# Patient Record
Sex: Female | Born: 1995 | Hispanic: No | Marital: Single | Smoking: Current every day smoker
Health system: Southern US, Community
[De-identification: ages and names within clinical notes are randomized; demographics above are authoritative.]

## PROBLEM LIST (undated history)

## (undated) ENCOUNTER — Inpatient Hospital Stay (HOSPITAL_COMMUNITY): Payer: Self-pay

## (undated) DIAGNOSIS — F909 Attention-deficit hyperactivity disorder, unspecified type: Secondary | ICD-10-CM

## (undated) DIAGNOSIS — O99345 Other mental disorders complicating the puerperium: Secondary | ICD-10-CM

## (undated) DIAGNOSIS — Z789 Other specified health status: Secondary | ICD-10-CM

## (undated) DIAGNOSIS — F53 Postpartum depression: Secondary | ICD-10-CM

## (undated) DIAGNOSIS — Z91040 Latex allergy status: Secondary | ICD-10-CM

## (undated) HISTORY — PX: NO PAST SURGERIES: SHX2092

---
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
## 2016-08-03 LAB — OB RESULTS CONSOLE GC/CHLAMYDIA
Chlamydia: NEGATIVE
Gonorrhea: NEGATIVE

## 2016-08-13 ENCOUNTER — Encounter (HOSPITAL_COMMUNITY): Payer: Self-pay

## 2016-08-13 ENCOUNTER — Inpatient Hospital Stay (HOSPITAL_COMMUNITY)
Admission: AD | Admit: 2016-08-13 | Discharge: 2016-08-13 | Disposition: A | Discharge status: Home / Self Care | Payer: Medicaid Other | Source: Ambulatory Visit | Attending: Obstetrics & Gynecology | Admitting: Obstetrics & Gynecology

## 2016-08-13 ENCOUNTER — Inpatient Hospital Stay (HOSPITAL_COMMUNITY): Payer: Medicaid Other

## 2016-08-13 DIAGNOSIS — O4693 Antepartum hemorrhage, unspecified, third trimester: Secondary | ICD-10-CM | POA: Insufficient documentation

## 2016-08-13 DIAGNOSIS — O4692 Antepartum hemorrhage, unspecified, second trimester: Secondary | ICD-10-CM

## 2016-08-13 DIAGNOSIS — O4703 False labor before 37 completed weeks of gestation, third trimester: Secondary | ICD-10-CM | POA: Diagnosis not present

## 2016-08-13 DIAGNOSIS — Z3A30 30 weeks gestation of pregnancy: Secondary | ICD-10-CM | POA: Insufficient documentation

## 2016-08-13 DIAGNOSIS — Z6790 Unspecified blood type, Rh positive: Secondary | ICD-10-CM | POA: Diagnosis not present

## 2016-08-13 DIAGNOSIS — Z87891 Personal history of nicotine dependence: Secondary | ICD-10-CM | POA: Diagnosis not present

## 2016-08-13 DIAGNOSIS — O0932 Supervision of pregnancy with insufficient antenatal care, second trimester: Secondary | ICD-10-CM | POA: Diagnosis not present

## 2016-08-13 DIAGNOSIS — O288 Other abnormal findings on antenatal screening of mother: Secondary | ICD-10-CM | POA: Diagnosis not present

## 2016-08-13 DIAGNOSIS — O26899 Other specified pregnancy related conditions, unspecified trimester: Secondary | ICD-10-CM

## 2016-08-13 DIAGNOSIS — R109 Unspecified abdominal pain: Secondary | ICD-10-CM | POA: Diagnosis present

## 2016-08-13 DIAGNOSIS — Z91040 Latex allergy status: Secondary | ICD-10-CM | POA: Diagnosis not present

## 2016-08-13 DIAGNOSIS — O26893 Other specified pregnancy related conditions, third trimester: Secondary | ICD-10-CM | POA: Insufficient documentation

## 2016-08-13 DIAGNOSIS — Z658 Other specified problems related to psychosocial circumstances: Secondary | ICD-10-CM

## 2016-08-13 DIAGNOSIS — F5089 Other specified eating disorder: Secondary | ICD-10-CM | POA: Diagnosis present

## 2016-08-13 HISTORY — DX: Latex allergy status: Z91.040

## 2016-08-13 LAB — URINALYSIS, ROUTINE W REFLEX MICROSCOPIC
Bilirubin Urine: NEGATIVE
Glucose, UA: NEGATIVE mg/dL
Hgb urine dipstick: NEGATIVE
Ketones, ur: NEGATIVE mg/dL
Nitrite: NEGATIVE
Protein, ur: NEGATIVE mg/dL
SPECIFIC GRAVITY, URINE: 1.023 (ref 1.005–1.030)
pH: 6 (ref 5.0–8.0)

## 2016-08-13 LAB — WET PREP, GENITAL
SPERM: NONE SEEN
Trich, Wet Prep: NONE SEEN
Yeast Wet Prep HPF POC: NONE SEEN

## 2016-08-13 LAB — RAPID URINE DRUG SCREEN, HOSP PERFORMED
AMPHETAMINES: NOT DETECTED
BENZODIAZEPINES: NOT DETECTED
Barbiturates: NOT DETECTED
COCAINE: NOT DETECTED
Opiates: NOT DETECTED
TETRAHYDROCANNABINOL: NOT DETECTED

## 2016-08-13 LAB — CBC
HEMATOCRIT: 32.1 % — AB (ref 36.0–46.0)
HEMOGLOBIN: 10.6 g/dL — AB (ref 12.0–15.0)
MCH: 27.5 pg (ref 26.0–34.0)
MCHC: 33 g/dL (ref 30.0–36.0)
MCV: 83.4 fL (ref 78.0–100.0)
Platelets: 256 10*3/uL (ref 150–400)
RBC: 3.85 MIL/uL — AB (ref 3.87–5.11)
RDW: 14 % (ref 11.5–15.5)
WBC: 8.1 10*3/uL (ref 4.0–10.5)

## 2016-08-13 LAB — ABO/RH: ABO/RH(D): AB POS

## 2016-08-13 LAB — OB RESULTS CONSOLE GC/CHLAMYDIA
CHLAMYDIA, DNA PROBE: NEGATIVE
Gonorrhea: NEGATIVE

## 2016-08-13 LAB — OB RESULTS CONSOLE HIV ANTIBODY (ROUTINE TESTING): HIV: NONREACTIVE

## 2016-08-13 MED ORDER — LACTATED RINGERS IV SOLN
INTRAVENOUS | Status: DC
  Administered 2016-08-13: 999 mL via INTRAVENOUS

## 2016-08-13 MED ORDER — CONCEPT DHA 53.5-38-1 MG PO CAPS: 1.0000 | ORAL_CAPSULE | Freq: Every day | ORAL | 11 refills | Status: DC

## 2016-08-13 MED ORDER — ACETAMINOPHEN 500 MG PO TABS
1000.0000 mg | ORAL_TABLET | Freq: Four times a day (QID) | ORAL | Status: DC | PRN
  Administered 2016-08-13: 1000 mg via ORAL
  Filled 2016-08-13: qty 2

## 2016-08-13 MED ORDER — NIFEDIPINE 10 MG PO CAPS
10.0000 mg | ORAL_CAPSULE | ORAL | Status: AC
  Administered 2016-08-13 (×2): 10 mg via ORAL
  Filled 2016-08-13 (×3): qty 1

## 2016-08-13 NOTE — MAU Note (Addendum)
Contacted Chaplain, Pt. Phone number given. Chaplain with contact Pt. On her private cell. Regarding current situation.

## 2016-08-13 NOTE — Progress Notes (Signed)
Written and verbal d/c instructions given and understanding voiced. 

## 2016-08-13 NOTE — MAU Note (Signed)
SW contacted [phone]), no answer left message. Pt. Desires a consult. After entering room a second time[facility], Pt does not wish to discuss situation while family member is present.
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
## 2016-08-13 NOTE — MAU Note (Signed)
Chaplain called [phone], no answer. RN to page [phone] waiting for return call.

## 2016-08-13 NOTE — Discharge Instructions (Signed)
Back Pain in Pregnancy °Back pain during pregnancy is common. Back pain may be caused by several factors that are related to changes during your pregnancy. °Follow these instructions at home: °Managing pain, stiffness, and swelling  °· If directed, apply ice for sudden (acute) back pain. °¨ Put ice in a plastic bag. °¨ Place a towel between your skin and the bag. °¨ Leave the ice on for 20 minutes, 2-3 times per day. °· If directed, apply heat to the affected area before you exercise: °¨ Place a towel between your skin and the heat pack or heating pad. °¨ Leave the heat on for 20-30 minutes. °¨ Remove the heat if your skin turns bright red. This is especially important if you are unable to feel pain, heat, or cold. You may have a greater risk of getting burned. °Activity  °· Exercise as told by your health care provider. Exercising is the best way to prevent or manage back pain. °· Listen to your body when lifting. If lifting hurts, ask for help or bend your knees. This uses your leg muscles instead of your back muscles. °· Squat down when picking up something from the floor. Do not bend over. °· Only use bed rest as told by your health care provider. Bed rest should only be used for the most severe episodes of back pain. °Standing, Sitting, and Lying Down  °· Do not stand in one place for long periods of time. °· Use good posture when sitting. Make sure your head rests over your shoulders and is not hanging forward. Use a pillow on your lower back if necessary. °· Try sleeping on your side, preferably the left side, with a pillow or two between your legs. If you are sore after a night's rest, your bed may be too soft. A firm mattress may provide more support for your back during pregnancy. °General instructions  °· Do not wear high heels. °· Eat a healthy diet. Try to gain weight within your health care provider's recommendations. °· Use a maternity girdle, elastic sling, or back brace as told by your health care  provider. °· Take over-the-counter and prescription medicines only as told by your health care provider. °· Keep all follow-up visits as told by your health care provider. This is important. This includes any visits with any specialists, such as a physical therapist. °Contact a health care provider if: °· Your back pain interferes with your daily activities. °· You have increasing pain in other parts of your body. °Get help right away if: °· You develop numbness, tingling, weakness, or problems with the use of your arms or legs. °· You develop severe back pain that is not controlled with medicine. °· You have a sudden change in bowel or bladder control. °· You develop shortness of breath, dizziness, or you faint. °· You develop nausea, vomiting, or sweating. °· You have back pain that is a rhythmic, cramping pain similar to labor pains. Labor pain is usually 1-2 minutes apart, lasts for about 1 minute, and involves a bearing down feeling or pressure in your pelvis. °· You have back pain and your water breaks or you have vaginal bleeding. °· You have back pain or numbness that travels down your leg. °· Your back pain developed after you fell. °· You develop pain on one side of your back. °· You see blood in your urine. °· You develop skin blisters in the area of your back pain. °This information is not intended to replace   advice given to you by your health care provider. Make sure you discuss any questions you have with your health care provider. Document Released: 06/23/2005 Document Revised: 08/21/2015 Document Reviewed: 11/27/2014 Elsevier Interactive Patient Education  2017 [employer] Ball CorporationBraxton Hicks Contractions Contractions of the uterus can occur throughout pregnancy, but they are not always a sign that you are in labor. You may have practice contractions called Braxton Hicks contractions. These false labor contractions are sometimes confused with true labor. What are Deberah PeltonBraxton Hicks  contractions? Braxton Hicks contractions are tightening movements that occur in the muscles of the uterus before labor. Unlike true labor contractions, these contractions do not result in opening (dilation) and thinning of the cervix. Toward the end of pregnancy (32-34 weeks), Braxton Hicks contractions can happen more often and may become stronger. These contractions are sometimes difficult to tell apart from true labor because they can be very uncomfortable. You should not feel embarrassed if you go to the hospital with false labor. Sometimes, the only way to tell if you are in true labor is for your health care provider to look for changes in the cervix. The health care provider will do a physical exam and may monitor your contractions. If you are not in true labor, the exam should show that your cervix is not dilating and your water has not broken. If there are no prenatal problems or other health problems associated with your pregnancy, it is completely safe for you to be sent home with false labor. You may continue to have Braxton Hicks contractions until you go into true labor. How can I tell the difference between true labor and false labor?  Differences  False labor  Contractions last 30-70 seconds.: Contractions are usually shorter and not as strong as true labor contractions.  Contractions become very regular.: Contractions are usually irregular.  Discomfort is usually felt in the top of the uterus, and it spreads to the lower abdomen and low back.: Contractions are often felt in the front of the lower abdomen and in the groin.  Contractions do not go away with walking.: Contractions may go away when you walk around or change positions while lying down.  Contractions usually become more intense and increase in frequency.: Contractions get weaker and are shorter-lasting as time goes on.  The cervix dilates and gets thinner.: The cervix usually does not dilate or become thin. Follow  these instructions at home:  Take over-the-counter and prescription medicines only as told by your health care provider.  Keep up with your usual exercises and follow other instructions from your health care provider.  Eat and drink lightly if you think you are going into labor.  If Braxton Hicks contractions are making you uncomfortable:  Change your position from lying down or resting to walking, or change from walking to resting.  Sit and rest in a tub of warm water.  Drink enough fluid to keep your urine clear or pale yellow. Dehydration may cause these contractions.  Do slow and deep breathing several times an hour.  Keep all follow-up prenatal visits as told by your health care provider. This is important. Contact a health care provider if:  You have a fever.  You have continuous pain in your abdomen. Get help right away if:  Your contractions become stronger, more regular, and closer together.  You have fluid leaking or gushing from your vagina.  You pass blood-tinged mucus (bloody show).  You have bleeding from your vagina.  You have low back  pain that you never had before.  You feel your babys head pushing down and causing pelvic pressure.  Your baby is not moving inside you as much as it used to. Summary  Contractions that occur before labor are called Braxton Hicks contractions, false labor, or practice contractions.  Braxton Hicks contractions are usually shorter, weaker, farther apart, and less regular than true labor contractions. True labor contractions usually become progressively stronger and regular and they become more frequent.  Manage discomfort from [facility] contractions by changing position, resting in a warm bath, drinking plenty of water, or practicing deep breathing. This information is not intended to replace advice given to you by your health care provider. Make sure you discuss any questions you have with your health care  provider. Document Released: 03/15/2005 Document Revised: 02/02/2016 Document Reviewed: 02/02/2016 Elsevier Interactive Patient Education  2017 [employer].

## 2016-08-13 NOTE — MAU Note (Signed)
Patient presents via EMS, no prenatal care, abdominal pain onset x 1 week, noticed vaginal bleeding around 10:30 am.

## 2016-08-14 LAB — HIV ANTIBODY (ROUTINE TESTING W REFLEX): HIV Screen 4th Generation wRfx: NONREACTIVE

## 2016-08-16 LAB — GC/CHLAMYDIA PROBE AMP (~~LOC~~) NOT AT ARMC
CHLAMYDIA, DNA PROBE: NEGATIVE
Neisseria Gonorrhea: NEGATIVE

## 2016-08-18 NOTE — Progress Notes (Signed)
CSW received message from MAU staff requesting for CSW to call patient due to social issues.  CSW called patient and she stated this was not a good time to talk because she was just waking up and was taking care of her son.  CSW provided her with contact number for CSW and asked her to call when it is convenient for her.

## 2016-08-19 LAB — OB RESULTS CONSOLE HIV ANTIBODY (ROUTINE TESTING): HIV: NONREACTIVE

## 2016-08-19 LAB — OB RESULTS CONSOLE HEPATITIS B SURFACE ANTIGEN
HEP B S AG: NEGATIVE
HEP B S AG: NEGATIVE

## 2016-08-19 LAB — OB RESULTS CONSOLE RUBELLA ANTIBODY, IGM
RUBELLA: IMMUNE
Rubella: IMMUNE

## 2016-08-19 LAB — OB RESULTS CONSOLE ABO/RH: RH Type: POSITIVE

## 2016-08-19 LAB — OB RESULTS CONSOLE RPR
RPR: NONREACTIVE
RPR: NONREACTIVE

## 2016-08-19 LAB — OB RESULTS CONSOLE ANTIBODY SCREEN: Antibody Screen: NEGATIVE

## 2016-09-08 ENCOUNTER — Encounter (HOSPITAL_COMMUNITY): Payer: Self-pay | Admitting: *Deleted

## 2016-09-08 ENCOUNTER — Emergency Department (HOSPITAL_COMMUNITY)
Admission: EM | Admit: 2016-09-08 | Discharge: 2016-09-08 | Disposition: A | Discharge status: Home / Self Care | Payer: Medicaid Other | Attending: Emergency Medicine | Admitting: Emergency Medicine

## 2016-09-08 DIAGNOSIS — Z87891 Personal history of nicotine dependence: Secondary | ICD-10-CM | POA: Insufficient documentation

## 2016-09-08 DIAGNOSIS — H6122 Impacted cerumen, left ear: Secondary | ICD-10-CM | POA: Insufficient documentation

## 2016-09-08 DIAGNOSIS — Z91040 Latex allergy status: Secondary | ICD-10-CM | POA: Insufficient documentation

## 2016-09-08 DIAGNOSIS — H6091 Unspecified otitis externa, right ear: Secondary | ICD-10-CM | POA: Diagnosis not present

## 2016-09-08 DIAGNOSIS — H9202 Otalgia, left ear: Secondary | ICD-10-CM | POA: Diagnosis present

## 2016-09-08 DIAGNOSIS — H60501 Unspecified acute noninfective otitis externa, right ear: Secondary | ICD-10-CM

## 2016-09-08 MED ORDER — ACETIC ACID 2 % OT SOLN: 4.0000 [drp] | Freq: Three times a day (TID) | OTIC | 0 refills | Status: DC

## 2016-09-08 MED ORDER — ACETIC ACID 2 % OT SOLN: 4.0000 [drp] | Freq: Three times a day (TID) | OTIC | 0 refills | Status: AC

## 2016-09-08 MED ORDER — DOUBLE ANTIBIOTIC 500-10000 UNIT/GM EX OINT: 1.0000 "application " | TOPICAL_OINTMENT | Freq: Two times a day (BID) | CUTANEOUS | 0 refills | Status: DC

## 2016-09-08 MED ORDER — OFLOXACIN 0.3 % OP SOLN: 1.0000 [drp] | Freq: Four times a day (QID) | OPHTHALMIC | 0 refills | Status: DC

## 2016-09-08 NOTE — Discharge Instructions (Signed)
Use acetic acid and oflox drops as prescribed.  Return to ED if you have worsening ear pain, fevers, or chills.  Follow up with your primary care if you still have some ear pain.

## 2016-09-08 NOTE — ED Provider Notes (Signed)
[facility]-EMERGENCY DEPT Provider Note   CSN: [id number] Arrival date & time: 09/08/16  1515     History   Chief Complaint Chief Complaint  Patient presents with  . Otalgia    HPI Dawn Levy is a 21 y.o. female resenting with 3 day history of left ear pain.  Patient states that 3 days ago her left ear was itchy, and she was using a Q-tip to try and relieve the itch. Later that night, she reports having increased sensitivity to loud noises and yelling. The following day she went to a water park. Additionally she reports some nasal congestion and a cough for the past 3 days, but denies sore throat, sinus pressure, eye symptoms, tinnitus, fever, chills, nausea, vomiting, abdominal pain, or chest pain. She has a history of seasonal allergies. She has not taken any thing for allergies, or to relieve her pain. She was at her OB office this morning, and when they take a look, they could not see the eardrum due to blood.  HPI  Past Medical History:  Diagnosis Date  . Latex allergy     Patient Active Problem List   Diagnosis Date Noted  . Pica 08/13/2016  . No prenatal care in current pregnancy in second trimester 08/13/2016  . Social discord 08/13/2016  . Vaginal bleeding in pregnancy, second trimester 08/13/2016  . Abdominal pain affecting pregnancy 08/13/2016    History reviewed. No pertinent surgical history.  OB History    Gravida Para Term Preterm AB Living   2 1 1     1    SAB TAB Ectopic Multiple Live Births           1       Home Medications    Prior to Admission medications   Medication Sig Start Date End Date Taking? Authorizing Provider  acetic acid (VOSOL) 2 % otic solution Place 4 drops into the left ear 3 (three) times daily. 09/08/16 09/15/16  Ward, Chase PicketJaime Pilcher, PA-C  ofloxacin (OCUFLOX) 0.3 % ophthalmic solution Place 1 drop into the left ear 4 (four) times daily. 09/08/16   Ward, Chase PicketJaime Pilcher, PA-C  Prenat-FeFum-FePo-FA-Omega 3 (CONCEPT DHA) 53.5-38-1 MG  CAPS Take 1 capsule by mouth daily. 08/13/16   Donette LarryBhambri, Melanie, CNM    Family History History reviewed. No pertinent family history.  Social History Social History  Substance Use Topics  . Smoking status: Former Games developermoker  . Smokeless tobacco: Never Used  . Alcohol use No     Allergies   Latex   Review of Systems Review of Systems  Constitutional: Negative for chills and fever.  HENT: Positive for ear pain. Negative for tinnitus.   Eyes: Negative for pain.  Respiratory: Positive for cough. Negative for shortness of breath and wheezing.   Cardiovascular: Negative for chest pain.     Physical Exam Updated Vital Signs BP 120/74 (BP Location: Left Arm)   Pulse 93   Temp 98.1 F (36.7 C) (Oral)   Resp 14   SpO2 (!) 88%   Physical Exam  Constitutional: She appears well-developed and well-nourished. No distress.  HENT:  Head: Normocephalic and atraumatic.  Right Ear: Hearing, tympanic membrane, external ear and ear canal normal.  Left Ear: External ear normal.  Nose: Nose normal. Right sinus exhibits no maxillary sinus tenderness and no frontal sinus tenderness. Left sinus exhibits no maxillary sinus tenderness and no frontal sinus tenderness.  Mouth/Throat: Uvula is midline, oropharynx is clear and moist and mucous membranes are normal. No oropharyngeal exudate, posterior  oropharyngeal edema or posterior oropharyngeal erythema.  Cannot see L TM or canal due to cotton obstructing view.  Eyes: Conjunctivae, EOM and lids are normal. Pupils are equal, round, and reactive to light.     ED Treatments / Results  Labs (all labs ordered are listed, but only abnormal results are displayed) Labs Reviewed - No data to display  EKG  EKG Interpretation None       Radiology No results found.  Procedures .Ear Cerumen Removal Date/Time: 09/08/2016 3:45 PM Performed by: Alveria Apley Authorized by: Alveria Apley   Consent:    Consent obtained:  Verbal   Consent  given by:  Patient   Risks discussed:  Pain Procedure details:    Location:  L ear   Procedure type: irrigation   Post-procedure details:    Inspection:  Macerated skin and TM intact   Hearing quality:  Improved   Patient tolerance of procedure:  Tolerated well, no immediate complications   (including critical care time)  Medications Ordered in ED Medications - No data to display   Initial Impression / Assessment and Plan / ED Course  I have reviewed the triage vital signs and the nursing notes.  Pertinent labs & imaging results that were available during my care of the patient were reviewed by me and considered in my medical decision making (see chart for details).     Patient presenting with ear pain and a history of Q-tip use with recent swimming. Initially could not see left TM, but after irrigation, removed copious amounts of earwax and some cotton. Post irrigation, left ear canal erythematous, with organismal growth. TM intact, and without bulging or purulent material noted. Will discharge patient with eardrops for otitis externa. Discussed cost appropriate use of eardrops and return precautions. Patient agrees to plan.  Final Clinical Impressions(s) / ED Diagnoses   Final diagnoses:  Acute otitis externa of right ear, unspecified type    New Prescriptions Discharge Medication List as of 09/08/2016  6:45 PM    START taking these medications   Details  ofloxacin (OCUFLOX) 0.3 % ophthalmic solution Place 1 drop into the left ear 4 (four) times daily., Starting Wed 09/08/2016, Print         [place], [place], PA-C 09/08/16 Johnnye Sima, MD 09/09/16 1229

## 2016-09-08 NOTE — ED Triage Notes (Signed)
Pt reports left ear pain, drainage and ringing in her ear for several days. Pt is approx [redacted] weeks pregnant, no ob/gyn complaints and was seen by pcp today.

## 2016-09-15 LAB — OB RESULTS CONSOLE GC/CHLAMYDIA
CHLAMYDIA, DNA PROBE: NEGATIVE
Gonorrhea: NEGATIVE

## 2016-09-15 LAB — OB RESULTS CONSOLE GBS: GBS: NEGATIVE

## 2016-10-15 ENCOUNTER — Inpatient Hospital Stay (HOSPITAL_COMMUNITY): Payer: Medicaid Other | Admitting: Anesthesiology

## 2016-10-15 ENCOUNTER — Encounter (HOSPITAL_COMMUNITY): Payer: Self-pay | Admitting: *Deleted

## 2016-10-15 ENCOUNTER — Inpatient Hospital Stay (HOSPITAL_COMMUNITY)
Admission: AD | Admit: 2016-10-15 | Discharge: 2016-10-17 | DRG: 775 | Disposition: A | Discharge status: Home / Self Care | Payer: Medicaid Other | Source: Ambulatory Visit | Attending: Obstetrics & Gynecology | Admitting: Obstetrics & Gynecology

## 2016-10-15 DIAGNOSIS — Z91040 Latex allergy status: Secondary | ICD-10-CM

## 2016-10-15 DIAGNOSIS — Z87891 Personal history of nicotine dependence: Secondary | ICD-10-CM

## 2016-10-15 DIAGNOSIS — O134 Gestational [pregnancy-induced] hypertension without significant proteinuria, complicating childbirth: Principal | ICD-10-CM | POA: Diagnosis present

## 2016-10-15 DIAGNOSIS — O133 Gestational [pregnancy-induced] hypertension without significant proteinuria, third trimester: Secondary | ICD-10-CM

## 2016-10-15 DIAGNOSIS — Z3A39 39 weeks gestation of pregnancy: Secondary | ICD-10-CM

## 2016-10-15 DIAGNOSIS — Z3493 Encounter for supervision of normal pregnancy, unspecified, third trimester: Secondary | ICD-10-CM | POA: Diagnosis present

## 2016-10-15 HISTORY — DX: Other specified health status: Z78.9

## 2016-10-15 LAB — CBC
HCT: 34.7 % — ABNORMAL LOW (ref 36.0–46.0)
Hemoglobin: 11.3 g/dL — ABNORMAL LOW (ref 12.0–15.0)
MCH: 26 pg (ref 26.0–34.0)
MCHC: 32.6 g/dL (ref 30.0–36.0)
MCV: 79.8 fL (ref 78.0–100.0)
PLATELETS: 250 10*3/uL (ref 150–400)
RBC: 4.35 MIL/uL (ref 3.87–5.11)
RDW: 15.6 % — AB (ref 11.5–15.5)
WBC: 9.6 10*3/uL (ref 4.0–10.5)

## 2016-10-15 LAB — COMPREHENSIVE METABOLIC PANEL
ALK PHOS: 136 U/L — AB (ref 38–126)
ALT: 11 U/L — AB (ref 14–54)
AST: 22 U/L (ref 15–41)
Albumin: 3.1 g/dL — ABNORMAL LOW (ref 3.5–5.0)
Anion gap: 9 (ref 5–15)
BUN: 20 mg/dL (ref 6–20)
CALCIUM: 9.2 mg/dL (ref 8.9–10.3)
CO2: 19 mmol/L — ABNORMAL LOW (ref 22–32)
CREATININE: 0.54 mg/dL (ref 0.44–1.00)
Chloride: 105 mmol/L (ref 101–111)
GFR calc non Af Amer: >60 mL/min (ref >60)
Glucose, Bld: 87 mg/dL (ref 65–99)
Potassium: 4.2 mmol/L (ref 3.5–5.1)
Sodium: 133 mmol/L — ABNORMAL LOW (ref 135–145)
Total Bilirubin: 0.6 mg/dL (ref 0.3–1.2)
Total Protein: 6.9 g/dL (ref 6.5–8.1)

## 2016-10-15 LAB — POCT FERN TEST: POCT Fern Test: NEGATIVE

## 2016-10-15 LAB — PROTEIN / CREATININE RATIO, URINE
Creatinine, Urine: 99 mg/dL
PROTEIN CREATININE RATIO: 1.29 mg/mg{creat} — AB (ref 0.00–0.15)
TOTAL PROTEIN, URINE: 128 mg/dL

## 2016-10-15 LAB — TYPE AND SCREEN
ABO/RH(D): AB POS
Antibody Screen: NEGATIVE

## 2016-10-15 MED ORDER — PHENYLEPHRINE 40 MCG/ML (10ML) SYRINGE FOR IV PUSH (FOR BLOOD PRESSURE SUPPORT)
80.0000 ug | PREFILLED_SYRINGE | INTRAVENOUS | Status: DC | PRN
  Filled 2016-10-15: qty 10
  Filled 2016-10-15: qty 5

## 2016-10-15 MED ORDER — OXYCODONE-ACETAMINOPHEN 5-325 MG PO TABS: 1.0000 | ORAL_TABLET | ORAL | Status: DC | PRN

## 2016-10-15 MED ORDER — FENTANYL CITRATE (PF) 100 MCG/2ML IJ SOLN: 50.0000 ug | INTRAMUSCULAR | Status: DC | PRN

## 2016-10-15 MED ORDER — ACETAMINOPHEN 325 MG PO TABS
650.0000 mg | ORAL_TABLET | ORAL | Status: DC | PRN
Start: 2016-10-15 — End: 2016-10-15

## 2016-10-15 MED ORDER — OXYTOCIN BOLUS FROM INFUSION
500.0000 mL | Freq: Once | INTRAVENOUS | Status: AC
  Administered 2016-10-15: 500 mL via INTRAVENOUS

## 2016-10-15 MED ORDER — FENTANYL CITRATE (PF) 100 MCG/2ML IJ SOLN
INTRAMUSCULAR | Status: AC
  Filled 2016-10-15: qty 2

## 2016-10-15 MED ORDER — EPHEDRINE 5 MG/ML INJ
10.0000 mg | INTRAVENOUS | Status: DC | PRN
  Filled 2016-10-15: qty 2

## 2016-10-15 MED ORDER — ACETAMINOPHEN 325 MG PO TABS
650.0000 mg | ORAL_TABLET | ORAL | Status: DC | PRN
  Administered 2016-10-15 – 2016-10-16 (×2): 650 mg via ORAL
  Filled 2016-10-15 (×2): qty 2

## 2016-10-15 MED ORDER — OXYCODONE-ACETAMINOPHEN 5-325 MG PO TABS: 2.0000 | ORAL_TABLET | ORAL | Status: DC | PRN

## 2016-10-15 MED ORDER — FENTANYL 2.5 MCG/ML BUPIVACAINE 1/10 % EPIDURAL INFUSION (WH - ANES)
14.0000 mL/h | INTRAMUSCULAR | Status: DC | PRN
  Administered 2016-10-15: 14 mL/h via EPIDURAL
  Filled 2016-10-15: qty 100

## 2016-10-15 MED ORDER — PHENYLEPHRINE 40 MCG/ML (10ML) SYRINGE FOR IV PUSH (FOR BLOOD PRESSURE SUPPORT)
80.0000 ug | PREFILLED_SYRINGE | INTRAVENOUS | Status: DC | PRN
  Filled 2016-10-15: qty 5

## 2016-10-15 MED ORDER — FLEET ENEMA 7-19 GM/118ML RE ENEM: 1.0000 | ENEMA | RECTAL | Status: DC | PRN

## 2016-10-15 MED ORDER — WITCH HAZEL-GLYCERIN EX PADS
1.0000 "application " | MEDICATED_PAD | CUTANEOUS | Status: DC | PRN
  Administered 2016-10-15: 1 via TOPICAL

## 2016-10-15 MED ORDER — DIPHENHYDRAMINE HCL 50 MG/ML IJ SOLN: 12.5000 mg | INTRAMUSCULAR | Status: DC | PRN

## 2016-10-15 MED ORDER — PRENATAL MULTIVITAMIN CH
1.0000 | ORAL_TABLET | Freq: Every day | ORAL | Status: DC
  Administered 2016-10-16 – 2016-10-17 (×2): 1 via ORAL
  Filled 2016-10-15 (×2): qty 1

## 2016-10-15 MED ORDER — OXYTOCIN 40 UNITS IN LACTATED RINGERS INFUSION - SIMPLE MED: 2.5000 [IU]/h | INTRAVENOUS | Status: DC

## 2016-10-15 MED ORDER — DIPHENHYDRAMINE HCL 25 MG PO CAPS: 25.0000 mg | ORAL_CAPSULE | Freq: Four times a day (QID) | ORAL | Status: DC | PRN

## 2016-10-15 MED ORDER — DIBUCAINE 1 % RE OINT: 1.0000 "application " | TOPICAL_OINTMENT | RECTAL | Status: DC | PRN

## 2016-10-15 MED ORDER — OXYTOCIN 40 UNITS IN LACTATED RINGERS INFUSION - SIMPLE MED
INTRAVENOUS | Status: AC
  Filled 2016-10-15: qty 1000

## 2016-10-15 MED ORDER — LIDOCAINE HCL (PF) 1 % IJ SOLN
INTRAMUSCULAR | Status: AC
  Filled 2016-10-15: qty 30

## 2016-10-15 MED ORDER — ONDANSETRON HCL 4 MG/2ML IJ SOLN: 4.0000 mg | Freq: Four times a day (QID) | INTRAMUSCULAR | Status: DC | PRN

## 2016-10-15 MED ORDER — IBUPROFEN 600 MG PO TABS
600.0000 mg | ORAL_TABLET | Freq: Four times a day (QID) | ORAL | Status: DC
  Administered 2016-10-15 – 2016-10-17 (×6): 600 mg via ORAL
  Filled 2016-10-15 (×8): qty 1

## 2016-10-15 MED ORDER — TETANUS-DIPHTH-ACELL PERTUSSIS 5-2.5-18.5 LF-MCG/0.5 IM SUSP: 0.5000 mL | Freq: Once | INTRAMUSCULAR | Status: DC

## 2016-10-15 MED ORDER — LACTATED RINGERS IV SOLN
INTRAVENOUS | Status: DC
  Administered 2016-10-15: 11:00:00 via INTRAVENOUS

## 2016-10-15 MED ORDER — LIDOCAINE HCL (PF) 1 % IJ SOLN
INTRAMUSCULAR | Status: DC | PRN
  Administered 2016-10-15 (×2): 5 mL via EPIDURAL

## 2016-10-15 MED ORDER — SENNOSIDES-DOCUSATE SODIUM 8.6-50 MG PO TABS
2.0000 | ORAL_TABLET | ORAL | Status: DC
  Administered 2016-10-16: 2 via ORAL
  Filled 2016-10-15 (×2): qty 2

## 2016-10-15 MED ORDER — SIMETHICONE 80 MG PO CHEW: 80.0000 mg | CHEWABLE_TABLET | ORAL | Status: DC | PRN

## 2016-10-15 MED ORDER — ONDANSETRON HCL 4 MG/2ML IJ SOLN: 4.0000 mg | INTRAMUSCULAR | Status: DC | PRN

## 2016-10-15 MED ORDER — COCONUT OIL OIL: 1.0000 "application " | TOPICAL_OIL | Status: DC | PRN

## 2016-10-15 MED ORDER — LACTATED RINGERS IV SOLN
500.0000 mL | Freq: Once | INTRAVENOUS | Status: AC
  Administered 2016-10-15: 500 mL via INTRAVENOUS

## 2016-10-15 MED ORDER — FENTANYL CITRATE (PF) 100 MCG/2ML IJ SOLN
100.0000 ug | INTRAMUSCULAR | Status: DC | PRN
  Administered 2016-10-15 (×2): 100 ug via INTRAVENOUS
  Filled 2016-10-15: qty 2

## 2016-10-15 MED ORDER — ZOLPIDEM TARTRATE 5 MG PO TABS: 5.0000 mg | ORAL_TABLET | Freq: Every evening | ORAL | Status: DC | PRN

## 2016-10-15 MED ORDER — MISOPROSTOL 200 MCG PO TABS
ORAL_TABLET | ORAL | Status: AC
  Filled 2016-10-15: qty 4

## 2016-10-15 MED ORDER — LACTATED RINGERS IV SOLN: 500.0000 mL | INTRAVENOUS | Status: DC | PRN

## 2016-10-15 MED ORDER — MISOPROSTOL 200 MCG PO TABS
800.0000 ug | ORAL_TABLET | Freq: Once | ORAL | Status: AC
  Administered 2016-10-15: 800 ug via RECTAL

## 2016-10-15 MED ORDER — ONDANSETRON HCL 4 MG PO TABS: 4.0000 mg | ORAL_TABLET | ORAL | Status: DC | PRN

## 2016-10-15 MED ORDER — BENZOCAINE-MENTHOL 20-0.5 % EX AERO: 1.0000 "application " | INHALATION_SPRAY | CUTANEOUS | Status: DC | PRN

## 2016-10-15 MED ORDER — SOD CITRATE-CITRIC ACID 500-334 MG/5ML PO SOLN: 30.0000 mL | ORAL | Status: DC | PRN

## 2016-10-15 MED ORDER — LIDOCAINE HCL (PF) 1 % IJ SOLN
30.0000 mL | INTRAMUSCULAR | Status: DC | PRN
  Filled 2016-10-15: qty 30

## 2016-10-15 NOTE — MAU Provider Note (Signed)
S: [name] is a 21 y.o. G2P1001 at 6815w3d  who presents to MAU today complaining of leaking of fluid since 4 pm yesterday & contractions since this morning. States leaking looks clear with bloody mucous. Positive fetal movement. Denies headache, visual disturbance, or epigastric pain. Hx of elevated BPs when in labor with previous baby.    O: BP (!) 136/93 (BP Location: Right Arm)   Pulse 81   Temp 97.7 F (36.5 C) (Oral)   Resp 18   SpO2 99%  GENERAL: Well-developed, well-nourished female in no acute distress.  HEAD: Normocephalic, atraumatic.  CHEST: Normal effort of breathing, regular heart rate ABDOMEN: Soft, nontender, gravid PELVIC: Normal external female genitalia. Vagina is pink and rugated. Cervix with normal contour, no lesions. Normal discharge.  No pooling. Small amount of blood tinged mucous.   Cervical exam:  Dilation: 5.5 Effacement (%): 80 Station: -2 Presentation: Vertex Exam by:: Estanislado SpireE Ceili Boshers NP   Fetal Monitoring: Baseline: 135 Variability: moderate Accelerations: 15x15 Decelerations: none Contractions: Q4-6 minutes  Results for orders placed or performed during the hospital encounter of 10/15/16 (from the past 24 hour(s))  POCT fern test     Status: Normal (Preliminary result)   Collection Time: 10/15/16 10:18 AM  Result Value Ref Range   POCT Fern Test Negative = intact amniotic membranes      A: SIUP at 6815w3d  Membranes intact  1. Indication for care in labor and delivery, antepartum   Active labor at term   P: Admit to birthing suites  [facility] ordered for elevated BPs GBS negative  Judeth HornLawrence, Berthold Glace, NP 10/15/2016 10:19 AM

## 2016-10-15 NOTE — Anesthesia Preprocedure Evaluation (Signed)
Anesthesia Evaluation  Patient identified by MRN, date of birth, ID band Patient awake    Reviewed: Allergy & Precautions, NPO status , Patient's Chart, lab work & pertinent test results  History of Anesthesia Complications Negative for: history of anesthetic complications  Airway Mallampati: II  TM Distance: >3 FB Neck ROM: Full    Dental no notable dental hx. (+) Dental Advisory Given   Pulmonary former smoker,    Pulmonary exam normal        Cardiovascular negative cardio ROS Normal cardiovascular exam     Neuro/Psych negative neurological ROS  negative psych ROS   GI/Hepatic negative GI ROS, Neg liver ROS,   Endo/Other  negative endocrine ROS  Renal/GU negative Renal ROS  negative genitourinary   Musculoskeletal negative musculoskeletal ROS (+)   Abdominal   Peds negative pediatric ROS (+)  Hematology negative hematology ROS (+)   Anesthesia Other Findings   Reproductive/Obstetrics (+) Pregnancy                             Anesthesia Physical Anesthesia Plan  ASA: II  Anesthesia Plan: Epidural   Post-op Pain Management:    Induction:   PONV Risk Score and Plan:   Airway Management Planned: Natural Airway  Additional Equipment:   Intra-op Plan:   Post-operative Plan:   Informed Consent: I have reviewed the patients History and Physical, chart, labs and discussed the procedure including the risks, benefits and alternatives for the proposed anesthesia with the patient or authorized representative who has indicated his/her understanding and acceptance.   Dental advisory given  Plan Discussed with: Anesthesiologist  Anesthesia Plan Comments:         Anesthesia Quick Evaluation

## 2016-10-15 NOTE — MAU Note (Signed)
C/o ucs since 2200 last night; states that she has had some bloody show; ?SROM @1400  yesterday; brught in by EMS;

## 2016-10-15 NOTE — Anesthesia Postprocedure Evaluation (Signed)
Anesthesia Post Note  Patient: Dawn Levy  Procedure(s) Performed: * No procedures listed *     Patient location during evaluation: Mother Baby Anesthesia Type: Epidural Level of consciousness: awake and alert and oriented Pain management: pain level controlled Respiratory status: spontaneous breathing and nonlabored ventilation Cardiovascular status: stable Postop Assessment: no headache, patient able to bend at knees, no backache, no signs of nausea or vomiting, epidural receding and adequate PO intake Anesthetic complications: no    Last Vitals:  Vitals:   10/15/16 1628 10/15/16 1723  BP: 134/62 (!) 122/57  Pulse: 73 82  Resp: 18 19  Temp: 37.1 C 36.7 C    Last Pain:  Vitals:   10/15/16 1723  TempSrc: Oral  PainSc:    Pain Goal:                 [name],[name]

## 2016-10-15 NOTE — H&P (Signed)
Obstetric History and Physical  Dawn Levy is a 21 y.o. G2P1001 with IUP at 5289w3d presenting for SOL. Patient states she has been having  regular, every 2-3 minutes contractions, none vaginal bleeding, intact, ruptured membranes, with active fetal movement.    Pren[facility]  with onset of care at 8 months of gestation. She feels there was a lot of things that may not have been offered due to her late initialOB visit.  She has had gestational HTN in both pregnancies. She was not treated with antihypertensives during either pregnancy. She denies RUQ pain, headache, or changes in vision. But notes that as she is not wearing her glasses today so she's not sure if she could tell us if she did have a HA as opposed to strain from not wearing glasses.   Pregnancy complications or risks: Patient Active Problem List   Diagnosis Date Noted  . Indication for care in labor and delivery, antepartum 10/15/2016  . Gestational (pregnancy-induced) hypertension without significant proteinuria, third trimester 10/15/2016  . Pica 08/13/2016  . No prenatal care in current pregnancy in second trimester 08/13/2016  . Social discord 08/13/2016  . Vaginal bleeding in pregnancy, second trimester 08/13/2016  . Abdominal pain affecting pregnancy 08/13/2016   She plans to breastfeed She desires oral progesterone-only contraceptive for postpartum contraception.  She does want a circumcision   Prenatal labs and studies: ABO, Rh: --/--/AB POS (07/20 1100) Antibody: NEG (07/20 1100) Rubella: Immune, Immune (05/24 0000) RPR: Nonreactive, Nonreactive (05/24 0000)  HBsAg: Negative, Negative (05/24 0000)  HIV: Non-reactive (05/24 0000)  ZOX:WRUEAVWUGBS:Negative (06/20 0000) 1 hr Glucola  normal   Prenatal Transfer Tool  Maternal Diabetes: No Maternal Substance Abuse:  No Significant Maternal Medications:  None Significant Maternal Lab Results: None  Past Medical History:   Diagnosis Date  . Latex allergy   . Medical history non-contributory     Past Surgical History:  Procedure Laterality Date  . NO PAST SURGERIES      OB History  Gravida Para Term Preterm AB Living  2 1 1     1   SAB TAB Ectopic Multiple Live Births          1    # Outcome Date GA Lbr Len/2nd Weight Sex Delivery Anes PTL Lv  2 Current           1 Term               Social History   Social History  . Marital status: Single    Spouse name: N/A  . Number of children: N/A  . Years of education: N/A   Social History Main Topics  . Smoking status: Former Games developermoker  . Smokeless tobacco: Former NeurosurgeonUser  . Alcohol use No  . Drug use: No  . Sexual activity: Yes   Other Topics Concern  . None   Social History Narrative  . None    History reviewed. No pertinent family history.  Prescriptions Prior to Admission  Medication Sig Dispense Refill Last Dose  . acetaminophen (TYLENOL) 325 MG tablet Take 650 mg by mouth every 6 (six) hours as needed for mild pain.   Past Week at Unknown time  . albuterol (PROVENTIL HFA;VENTOLIN HFA) 108 (90 Base) MCG/ACT inhaler Inhale into the lungs every 6 (six) hours as needed for wheezing or shortness of breath.   prn  . Prenat-FeFum-FePo-FA-Omega 3 (CONCEPT DHA) 53.5-38-1 MG CAPS Take 1 capsule by mouth daily. 30 capsule  11 Past Month at Unknown time  . ofloxacin (OCUFLOX) 0.3 % ophthalmic solution Place 1 drop into the left ear 4 (four) times daily. (Patient not taking: Reported on 10/15/2016) 5 mL 0 Not Taking at Unknown time    Allergies  Allergen Reactions  . Latex     Review of Systems: Negative except for what is mentioned in HPI.  Physical Exam: BP 129/80   Pulse 74   Temp 97.8 F (36.6 C) (Oral)   Resp 18   Ht 5\' 3"  (1.6 m)   Wt 85.7 kg (189 lb)   SpO2 99%   BMI 33.48 kg/m  CONSTITUTIONAL: Well-developed, well-nourished female in no acute distress.  HENT:  Normocephalic, atraumatic, External right and left ear normal.  Oropharynx is clear and moist EYES: Conjunctivae and EOM are normal. Pupils are equal, round, and reactive to light. No scleral icterus.  NECK: Normal range of motion, supple, no masses SKIN: Skin is warm and dry. No rash noted. Not diaphoretic. No erythema. No pallor. NEUROLOGIC: Alert and oriented to person, place, and time. Normal reflexes, muscle tone coordination. No cranial nerve deficit noted. PSYCHIATRIC: Normal mood and affect. Normal behavior. Normal judgment and thought content. CARDIOVASCULAR: Normal heart rate noted, regular rhythm RESPIRATORY: Effort and breath sounds normal, no problems with respiration noted ABDOMEN: Soft, nontender, nondistended, gravid. MUSCULOSKELETAL: Normal range of motion. No edema and no tenderness. 2+ distal pulses.  Dilation: 10 Dilation Complete Date: 10/15/16 Dilation Complete Time: 1245 Effacement (%): 100 Station: +2 Presentation: Vertex Exam by:: AThersa Salt RN  FHT:  Baseline rate   bpm 140 Variability moderate  Accelerations present   Decelerations none Contractions: Every 4-6 mins per the patient    Pertinent Labs/Studies:   Results for orders placed or performed during the hospital encounter of 10/15/16 (from the past 24 hour(s))  POCT fern test     Status: Normal (Preliminary result)   Collection Time: 10/15/16 10:18 AM  Result Value Ref Range   POCT Fern Test Negative = intact amniotic membranes   CBC     Status: Abnormal   Collection Time: 10/15/16 10:42 AM  Result Value Ref Range   WBC 9.6 4.0 - 10.5 K/uL   RBC 4.35 3.87 - 5.11 MIL/uL   Hemoglobin 11.3 (L) 12.0 - 15.0 g/dL   HCT 16.1 (L) 09.6 - 04.5 %   MCV 79.8 78.0 - 100.0 fL   MCH 26.0 26.0 - 34.0 pg   MCHC 32.6 30.0 - 36.0 g/dL   RDW 40.9 (H) 81.1 - 91.4 %   Platelets 250 150 - 400 K/uL  Comprehensive metabolic panel     Status: Abnormal   Collection Time: 10/15/16 10:42 AM  Result Value Ref Range   Sodium 133 (L) 135 - 145 mmol/L   Potassium 4.2 3.5 - 5.1 mmol/L    Chloride 105 101 - 111 mmol/L   CO2 19 (L) 22 - 32 mmol/L   Glucose, Bld 87 65 - 99 mg/dL   BUN 20 6 - 20 mg/dL   Creatinine, Ser 7.82 0.44 - 1.00 mg/dL   Calcium 9.2 8.9 - 95.6 mg/dL   Total Protein 6.9 6.5 - 8.1 g/dL   Albumin 3.1 (L) 3.5 - 5.0 g/dL   AST 22 15 - 41 U/L   ALT 11 (L) 14 - 54 U/L   Alkaline Phosphatase 136 (H) 38 - 126 U/L   Total Bilirubin 0.6 0.3 - 1.2 mg/dL   GFR calc non Af Amer >60 >60 mL/min  GFR calc Af Amer >60 >60 mL/min   Anion gap 9 5 - 15  Type and screen [facility]     Status: None   Collection Time: 10/15/16 11:00 AM  Result Value Ref Range   ABO/RH(D) AB POS    Antibody Screen NEG    Sample Expiration 10/18/2016     Assessment : Dawn Levy is a 21 y.o. G2P1001 at [redacted]w[redacted]d being admitted for SOL  Plan: Labor: Expectant management. Analgesia as needed. FWB: Reassuring fetal heart tracing.  GBS negative Delivery plan: Hopeful for vaginal delivery Check PreE labs and monitor blood pressures.   Lilian Kapur MS3  OB FELLOW HISTORY AND PHYSICAL ATTESTATION  I confirm that I have verified the information documented in the medical students's note and edited as needed and that I have also personally performed the physical exam and all medical decision making activities. In addition: Patient in advance labor. Expectant delivery soon.Monitor BPs.   Caryl Ada, DO  10/15/2016, 4:10 PM

## 2016-10-15 NOTE — Progress Notes (Signed)
Pt oob to br voided small amt-clean catch urine obtained-small amt caught in specimen

## 2016-10-15 NOTE — Anesthesia Procedure Notes (Signed)
Epidural Patient location during procedure: OB Start time: 10/15/2016 12:32 PM End time: 10/15/2016 12:42 PM  Staffing Anesthesiologist: Heather RobertsSINGER, Nyiesha Beever Performed: anesthesiologist   Preanesthetic Checklist Completed: patient identified, site marked, pre-op evaluation, timeout performed, IV checked, risks and benefits discussed and monitors and equipment checked  Epidural Patient position: sitting Prep: DuraPrep Patient monitoring: heart rate, cardiac monitor, continuous pulse ox and blood pressure Approach: midline Location: L2-L3 Injection technique: LOR saline  Needle:  Needle type: Tuohy  Needle gauge: 17 G Needle length: 9 cm Needle insertion depth: 6 cm Catheter size: 20 Guage Catheter at skin depth: 11 cm Test dose: negative and Other  Assessment Events: blood not aspirated, injection not painful, no injection resistance and negative IV test  Additional Notes Informed consent obtained prior to proceeding including risk of failure, 1% risk of PDPH, risk of minor discomfort and bruising.  Discussed rare but serious complications including epidural abscess, permanent nerve injury, epidural hematoma.  Discussed alternatives to epidural analgesia and patient desires to proceed.  Timeout performed pre-procedure verifying patient name, procedure, and platelet count.  Patient tolerated procedure well.

## 2016-10-16 DIAGNOSIS — Z3A39 39 weeks gestation of pregnancy: Secondary | ICD-10-CM

## 2016-10-16 LAB — RPR: RPR Ser Ql: NONREACTIVE

## 2016-10-16 NOTE — Lactation Note (Signed)
This note was copied from a baby's chart. Lactation Consultation Note  Patient Name: Dawn Levy EAVWU'JToday's Date: 10/16/2016 Reason for consult: Initial assessment  Baby is 25 hours old and has been to the breast majority of feeding except one feeding with a bottle of 10 ml of EBM ,  LC reviewed doc flow sheets , voids and stools QS, Latch scores 8-10 . Mom denies soreness.  At consult baby showing feeding cues, mom latched the baby , just needed to be shown how to flip upper lip to flanges position.  Multiple swallows noted, increased with breast compressions. Baby fed was still feeding at 10 mins.  When LC went back to check on nom she mentioned the baby had fed 45 mins.  Mom already has the hand pump and aware of how to use it.  Mother informed of post-discharge support and given phone number to the lactation department, including services for phone call assistance; out-patient appointments; and breastfeeding support group. List of other breastfeeding resources in the community given in the handout. Encouraged mother to call for problems or concerns related to breastfeeding.   Maternal Data Has patient been taught Hand Expression?: Yes  Feeding Feeding Type: Breast Fed Length of feed: 45 min (multiple swallows noted )  LATCH Score/Interventions Latch: Grasps breast easily, tongue down, lips flanged, rhythmical sucking. Intervention(s): Adjust position;Assist with latch;Breast massage;Breast compression  Audible Swallowing: Spontaneous and intermittent  Type of Nipple: Everted at rest and after stimulation  Comfort (Breast/Nipple): Soft / non-tender     Hold (Positioning): Assistance needed to correctly position infant at breast and maintain latch. Intervention(s): Breastfeeding basics reviewed;Support Pillows;Position options;Skin to skin  LATCH Score: 9  Lactation Tools Discussed/Used Tools: Pump ([facility] RN had given mom a hand pump yesterday ) Breast pump type:  Manual Pump Review: Setup, frequency, and cleaning (already by the RN )   Consult Status Consult Status: Follow-up Date: 10/16/16 Follow-up type: In-patient    Matilde SprangMargaret Ann Tessia Kassin 10/16/2016, 3:20 PM

## 2016-10-16 NOTE — Progress Notes (Signed)
Post Partum Day 1 Subjective: no complaints, up ad lib, voiding, tolerating PO and + flatus States that she would now like a cheaper option for a circ and will do this outpatient.  Objective: Blood pressure (!) 122/57, pulse 68, temperature (!) 97.5 F (36.4 C), temperature source Oral, resp. rate 20, height 5\' 3"  (1.6 m), weight 85.7 kg (189 lb), SpO2 99 %, unknown if currently breastfeeding.  Physical Exam:  General: alert and cooperative Lochia: appropriate Uterine Fundus: firm Incision: n/a DVT Evaluation: No evidence of DVT seen on physical exam. Negative Homan's sign. No significant calf/ankle edema.   Recent Labs  10/15/16 1042  HGB 11.3*  HCT 34.7*    Assessment/Plan: Plan for discharge tomorrow   LOS: 1 day   [place] Shirley 10/16/2016, 6:42 AM   OB FELLOW POSTPARTUM PROGRESS NOTE ATTESTATION  I have seen and examined this patient and agree with above documentation in the resident's note.   Frederik PearJulie P Demian Maisel, MD OB Fellow 9:08 AM
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
## 2016-10-17 DIAGNOSIS — Z3A39 39 weeks gestation of pregnancy: Secondary | ICD-10-CM

## 2016-10-17 MED ORDER — IBUPROFEN 600 MG PO TABS: 600.0000 mg | ORAL_TABLET | Freq: Four times a day (QID) | ORAL | 0 refills | Status: DC

## 2016-10-17 MED ORDER — DOCUSATE SODIUM 100 MG PO CAPS: 100.0000 mg | ORAL_CAPSULE | Freq: Two times a day (BID) | ORAL | 2 refills | Status: DC | PRN

## 2016-10-17 NOTE — Plan of Care (Signed)
Problem: Coping: Goal: Ability to cope will improve Outcome: Progressing Social work consult in place.

## 2016-10-17 NOTE — Progress Notes (Signed)
CSW received a phone call from [name]'s bedside RN noting [name] seems to be expressing odd behaviors such as hoarding diapers and requesting photos of the car seat to show her mom before supplying the $30.00 cash to pay. This Clinical research associatewriter informed RN that she is unable to provide pictures of the car seat for [name]'s mother as that is not something we do. RN understood and noted she would inform [name] and [name]'s mom. Additionally, RN noted at the very least she will take photos of the car seat after obtaining it from house coverage and send pictures herself.   RN expressed concern for [name] not being able to be awoken overnight to care for the baby and requested this writer confirm [name] has the support of [name]. This Clinical research associatewriter confirmed that baby and [name] will have the support of [name] upon d/c from the hospital. Additionally, [name] identified her aunt and uncle as supports locally.   Dawn Levy, MSW, LCSW-A Clinical Social Worker  [facility]  Office: [phone]

## 2016-10-17 NOTE — Discharge Summary (Signed)
Obstetric Discharge Summary Reason for Admission: onset of labor Prenatal Procedures: ultrasound Intrapartum Procedures: spontaneous vaginal delivery Postpartum Procedures: none Complications-Operative and Postpartum: none Hemoglobin  Date Value Ref Range Status  10/15/2016 11.3 (L) 12.0 - 15.0 g/dL Final   HCT  Date Value Ref Range Status  10/15/2016 34.7 (L) 36.0 - 46.0 % Final    Physical Exam:  General: alert, cooperative and no distress Lochia: appropriate Uterine Fundus: firm Incision: n/a DVT Evaluation: No evidence of DVT seen on physical exam. Negative Homan's sign. No cords or calf tenderness. No significant calf/ankle edema.  Discharge Diagnoses: Term Pregnancy-delivered  Discharge Information: Date: 10/17/2016 Activity: pelvic rest Diet: routine Medications: Ibuprofen and Colace Condition: stable Instructions: refer to practice specific booklet Discharge to: home  Contraception: OCPs Follow-up Information    [facility]. Schedule an appointment as soon as possible for a visit in 6 week(s).   Contact information: [address] [place] [id number] [phone]           Newborn Data: Live born female  Birth Weight: 7 lb 10.4 oz (3470 g) APGAR: 9, 9  Home with mother.  Sharen CounterLisa Leftwich-Kirby 10/17/2016, 9:56 AM
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
## 2016-10-17 NOTE — Progress Notes (Signed)
CSW acknowledges consult regarding [name]'s need for a car seat and needing to confirm [name]'s living with grandparent. This writer met with [name] yesterday and discussed car seat needs. House coverage will need to provide [name] with car seat and car seat release form to complete at the time of d/c. [name] aware of $30.00 cash cost. Additionally, [name] confirmed with this writer that she is living with her mother, baby's [name] and her older son Ezekiel Paloma that is 1 years old. Please see CSW note from 10/16/2016 for further detail.   Tynan Boesel, MSW, LCSW-A Clinical Social Worker  [facility]  Office: [phone]   

## 2016-10-18 ENCOUNTER — Ambulatory Visit: Payer: Self-pay

## 2016-10-18 NOTE — Progress Notes (Signed)
CSW was informed by CPS Supervisior, Josh Cannon, that there are no barriers to d/c and CPS will follow-up with family after d/c.  CSW updated bedside nurse and Teaching Service.   Mate Alegria Boyd-Gilyard, MSW, LCSW Clinical Social Work [phone] 
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
## 2016-10-21 NOTE — Progress Notes (Signed)
Post discharge chart review completed.  

## 2017-05-18 ENCOUNTER — Encounter (HOSPITAL_COMMUNITY): Payer: Self-pay | Admitting: Family Medicine

## 2017-05-18 ENCOUNTER — Emergency Department (HOSPITAL_COMMUNITY)
Admission: EM | Admit: 2017-05-18 | Discharge: 2017-05-19 | Disposition: A | Discharge status: Home / Self Care | Payer: Medicaid Other | Attending: Emergency Medicine | Admitting: Emergency Medicine

## 2017-05-18 DIAGNOSIS — F4325 Adjustment disorder with mixed disturbance of emotions and conduct: Secondary | ICD-10-CM | POA: Diagnosis present

## 2017-05-18 DIAGNOSIS — Z91040 Latex allergy status: Secondary | ICD-10-CM | POA: Diagnosis not present

## 2017-05-18 DIAGNOSIS — F329 Major depressive disorder, single episode, unspecified: Secondary | ICD-10-CM | POA: Insufficient documentation

## 2017-05-18 DIAGNOSIS — Z638 Other specified problems related to primary support group: Secondary | ICD-10-CM | POA: Diagnosis not present

## 2017-05-18 DIAGNOSIS — Z87891 Personal history of nicotine dependence: Secondary | ICD-10-CM | POA: Insufficient documentation

## 2017-05-18 HISTORY — DX: Attention-deficit hyperactivity disorder, unspecified type: F90.9

## 2017-05-18 HISTORY — DX: Postpartum depression: F53.0

## 2017-05-18 HISTORY — DX: Other mental disorders complicating the puerperium: O99.345

## 2017-05-18 LAB — CBC WITH DIFFERENTIAL/PLATELET
BASOS ABS: 0 10*3/uL (ref 0.0–0.1)
BASOS PCT: 0 %
EOS PCT: 3 %
Eosinophils Absolute: 0.3 10*3/uL (ref 0.0–0.7)
HCT: 43.2 % (ref 36.0–46.0)
Hemoglobin: 14.2 g/dL (ref 12.0–15.0)
LYMPHS PCT: 18 %
Lymphs Abs: 1.5 10*3/uL (ref 0.7–4.0)
MCH: 28 pg (ref 26.0–34.0)
MCHC: 32.9 g/dL (ref 30.0–36.0)
MCV: 85.2 fL (ref 78.0–100.0)
Monocytes Absolute: 0.6 10*3/uL (ref 0.1–1.0)
Monocytes Relative: 7 %
Neutro Abs: 6.3 10*3/uL (ref 1.7–7.7)
Neutrophils Relative %: 72 %
PLATELETS: 319 10*3/uL (ref 150–400)
RBC: 5.07 MIL/uL (ref 3.87–5.11)
RDW: 13.8 % (ref 11.5–15.5)
WBC: 8.7 10*3/uL (ref 4.0–10.5)

## 2017-05-18 LAB — COMPREHENSIVE METABOLIC PANEL
ALBUMIN: 4.6 g/dL (ref 3.5–5.0)
ALK PHOS: 72 U/L (ref 38–126)
ALT: 20 U/L (ref 14–54)
AST: 16 U/L (ref 15–41)
Anion gap: 11 (ref 5–15)
BILIRUBIN TOTAL: 1.3 mg/dL — AB (ref 0.3–1.2)
BUN: 19 mg/dL (ref 6–20)
CALCIUM: 10 mg/dL (ref 8.9–10.3)
CO2: 23 mmol/L (ref 22–32)
Chloride: 108 mmol/L (ref 101–111)
Creatinine, Ser: 0.68 mg/dL (ref 0.44–1.00)
GFR calc Af Amer: >60 mL/min (ref >60)
GFR calc non Af Amer: >60 mL/min (ref >60)
GLUCOSE: 81 mg/dL (ref 65–99)
POTASSIUM: 3.8 mmol/L (ref 3.5–5.1)
Sodium: 142 mmol/L (ref 135–145)
TOTAL PROTEIN: 8.1 g/dL (ref 6.5–8.1)

## 2017-05-18 LAB — RAPID URINE DRUG SCREEN, HOSP PERFORMED
Amphetamines: NOT DETECTED
BARBITURATES: NOT DETECTED
BENZODIAZEPINES: NOT DETECTED
Cocaine: NOT DETECTED
Opiates: NOT DETECTED
Tetrahydrocannabinol: NOT DETECTED

## 2017-05-18 LAB — I-STAT BETA HCG BLOOD, ED (MC, WL, AP ONLY): I-stat hCG, quantitative: <5 m[IU]/mL (ref <5)

## 2017-05-18 LAB — ETHANOL: Alcohol, Ethyl (B): <10 mg/dL (ref <10)

## 2017-05-18 MED ORDER — ACETAMINOPHEN 325 MG PO TABS
650.0000 mg | ORAL_TABLET | Freq: Once | ORAL | Status: AC
Start: 2017-05-18 — End: 2017-05-18
  Administered 2017-05-18: 650 mg via ORAL
  Filled 2017-05-18: qty 2

## 2017-05-18 MED ORDER — ACETAMINOPHEN 325 MG PO TABS: 650.0000 mg | ORAL_TABLET | Freq: Four times a day (QID) | ORAL | Status: DC | PRN
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
## 2017-05-18 NOTE — ED Notes (Signed)
Dr. Lynelle DoctorKnapp has already assessed patient before triage and placed orders.
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
## 2017-05-18 NOTE — ED Notes (Signed)
[id number] Expected date:  Expected time:  Means of arrival:  Comments: Tr 3

## 2017-05-18 NOTE — ED Notes (Signed)
Bed: [facility] Expected date:  Expected time:  Means of arrival:  Comments: 

## 2017-05-18 NOTE — ED Notes (Signed)
Provided patient a sandwich, graham crackers, and coke/ice with providing Tylenol.

## 2017-05-18 NOTE — ED Triage Notes (Signed)
Patient reports she made a comment, "postpartum leads to suicide, but I don't want to be that person", to her mother while she was at her son's developmental appointment. Patient denies being suicidal and/or homicidal. She states she is "very emotional".
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
## 2017-05-19 DIAGNOSIS — Z87891 Personal history of nicotine dependence: Secondary | ICD-10-CM | POA: Diagnosis not present

## 2017-05-19 DIAGNOSIS — F4325 Adjustment disorder with mixed disturbance of emotions and conduct: Secondary | ICD-10-CM | POA: Diagnosis not present

## 2017-05-19 DIAGNOSIS — Z638 Other specified problems related to primary support group: Secondary | ICD-10-CM | POA: Diagnosis not present
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
## 2017-05-19 NOTE — BH Assessment (Signed)
[facility] Assessment Progress Note  Per Jacqueline Norman, DO, this pt does not require psychiatric hospitalization at this time.  [facility] with recommendation to [facility] with [facility].  This has been included in pt's discharge instructions.  Pt's nurse, Cynthia, has been notified.  Dawn Pentland, MA Triage Specialist [phone]     

## 2017-05-19 NOTE — [facility] Suicide Risk Assessment (Signed)
Suicide Risk Assessment  Discharge Assessment   [facility]   Principal Problem: Adjustment disorder with mixed disturbance of emotions and conduct Discharge Diagnoses:  Patient Active Problem List   Diagnosis Date Noted  . Adjustment disorder with mixed disturbance of emotions and conduct [F43.25] 05/19/2017    Priority: High  . NSVD (normal spontaneous vaginal delivery) [O80] 10/17/2016  . Indication for care in labor and delivery, antepartum [O75.9] 10/15/2016  . Gestational (pregnancy-induced) hypertension without significant proteinuria, third trimester [O13.3] 10/15/2016  . Pica [F50.89] 08/13/2016  . No prenatal care in current pregnancy in second trimester [O09.32] 08/13/2016  . Social discord [Z65.8] 08/13/2016  . Vaginal bleeding in pregnancy, second trimester [O46.92] 08/13/2016  . Abdominal pain affecting pregnancy [O26.899, R10.9] 08/13/2016    Total Time spent with patient: 45 minutes Musculoskeletal: Strength & Muscle Tone: within normal limits Gait & Station: normal Patient leans: N/A  Psychiatric Specialty Exam: Physical Exam  Constitutional: She is oriented to person, place, and time. She appears well-developed and well-nourished.  HENT:  Head: Normocephalic.  Neck: Normal range of motion.  Respiratory: Effort normal.  Musculoskeletal: Normal range of motion.  Neurological: She is alert and oriented to person, place, and time.  Psychiatric: She has a normal mood and affect. Her speech is normal and behavior is normal. Judgment and thought content normal. Cognition and memory are normal.    Review of Systems  All other systems reviewed and are negative.   Blood pressure 114/61, pulse 83, temperature 98.9 F (37.2 C), resp. rate 20, height 5\' 3"  (1.6 m), weight 60.8 kg (134 lb), last menstrual period 05/16/2017, SpO2 98 %, unknown if currently breastfeeding.Body mass index is 23.74 kg/m.  General Appearance: Casual  Eye Contact:   Good  Speech:  Normal Rate  Volume:  Normal  Mood:  Euthymic  Affect:  Congruent  Thought Process:  Coherent and Descriptions of Associations: Intact  Orientation:  Full (Time, Place, and Person)  Thought Content:  WDL and Logical  Suicidal Thoughts:  No  Homicidal Thoughts:  No  Memory:  Immediate;   Good Recent;   Good Remote;   Good  Judgement:  Fair  Insight:  Fair  Psychomotor Activity:  Normal  Concentration:  Concentration: Good and Attention Span: Good  Recall:  Good  Fund of Knowledge:  Fair  Language:  Good  Akathisia:  No  Handed:  Right  AIMS (if indicated):     Assets:  Housing Leisure Time Physical Health Resilience Social Support  ADL's:  Intact  Cognition:  WNL  Sleep:       Mental Status Per Nursing Assessment::   On Admission:   upset about not being able to move back to [place]  Demographic Factors:  Adolescent or young adult and Caucasian  Loss Factors: NA  Historical Factors: NA  Risk Reduction Factors:   Responsible for children under 21 years of age, Sense of responsibility to family, Living with another person, especially a relative and Positive social support  Continued Clinical Symptoms:  None  Cognitive Features That Contribute To Risk:  None    Suicide Risk:  Minimal: No identifiable suicidal ideation.  Patients presenting with no risk factors but with morbid ruminations; may be classified as minimal risk based on the severity of the depressive symptoms    Plan Of Care/Follow-up recommendations:  Activity:  as tolerated Diet:  heart healthy diet  LORD, JAMISON, NP 05/19/2017, 11:16 AM

## 2017-05-19 NOTE — Discharge Instructions (Signed)
For your behavioral health needs you are advised to follow up with [facility].  New patients are seen at their walk-in clinic.  Walk-in hours are Monday - Friday from 8:00 am - 12:00 pm, and from 1:00 pm - 3:00 pm.  Walk-in patients are seen on a first come, first served basis, so try to arrive as early as possible for the best chance of being seen the same day.  There is an initial fee of $22.50: ° °     [facility] °     [address] °     [place], [place] [address] °     [phone] °

## 2018-12-26 IMAGING — US US MFM OB COMP +14 WKS
1 series · 14 of 28 positions shown · non-contrast
Comparison: none

[Series 1: us mfm ob comp +14 wks · 70 acquisitions, 14 frames shown]
[im 3/70]
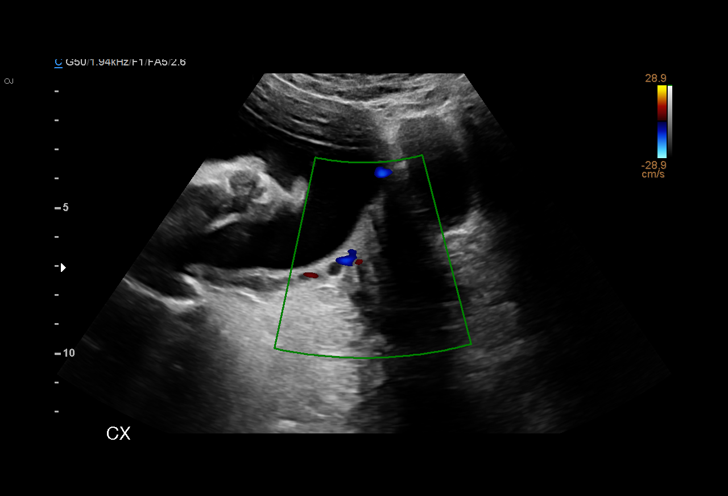
[im 8/70]
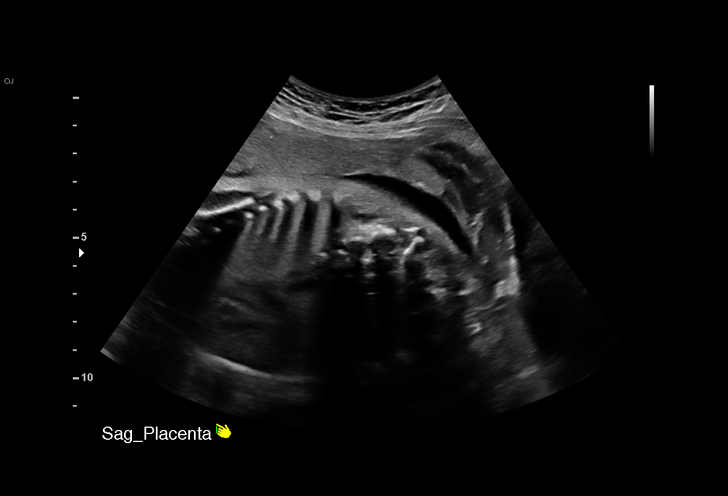
[im 13/70]
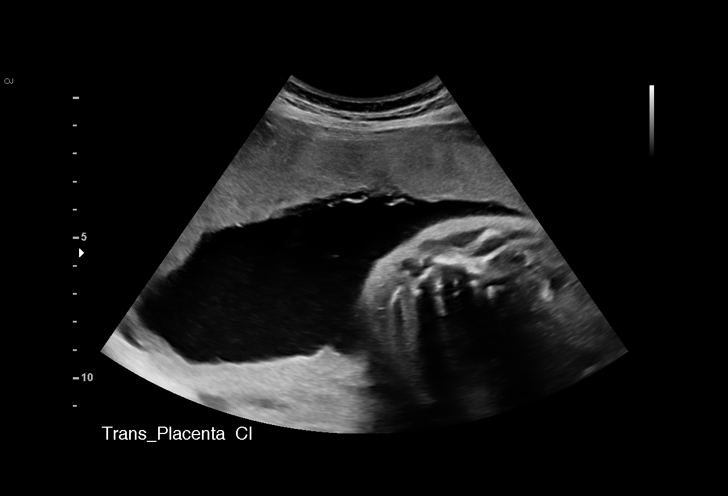
[im 18/70]
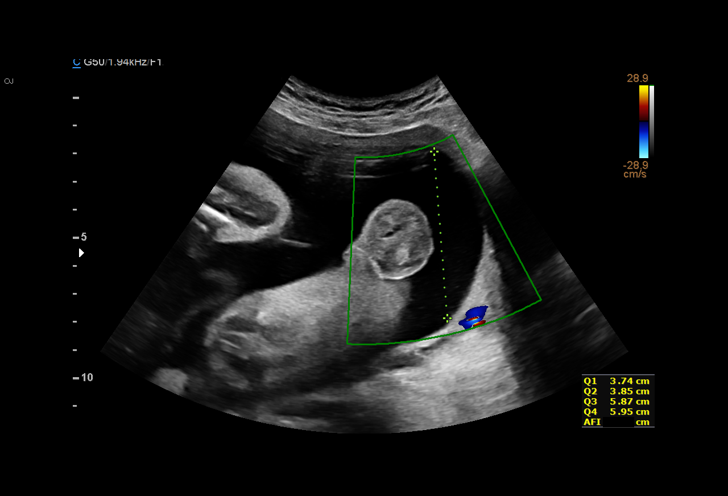
[im 24/70]
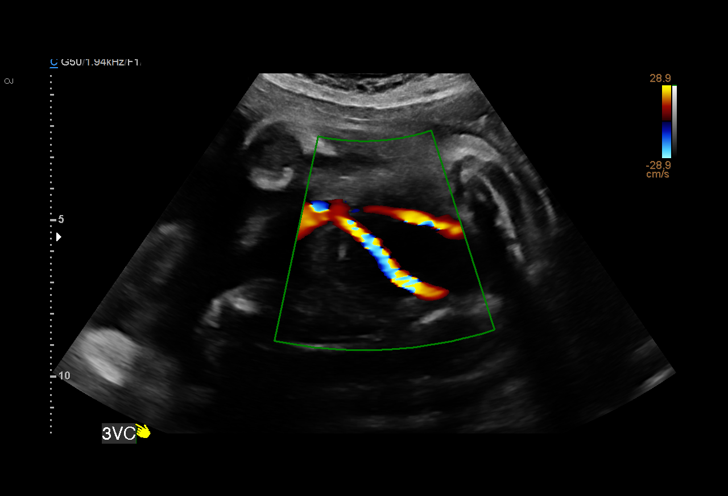
[im 29/70]
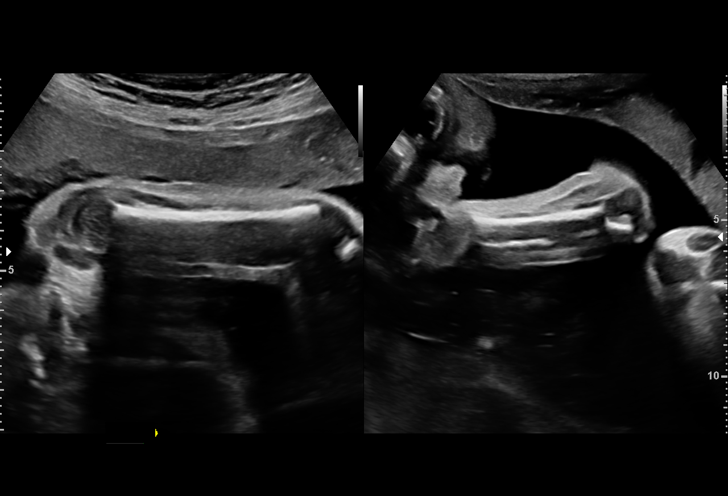
[im 34/70]
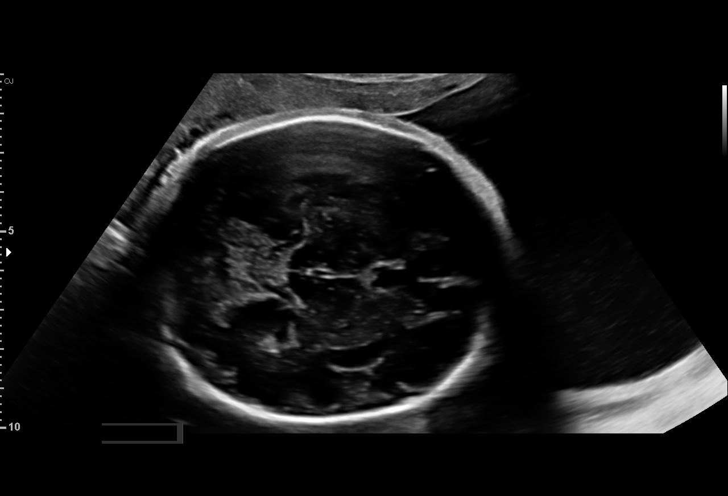
[im 39/70]
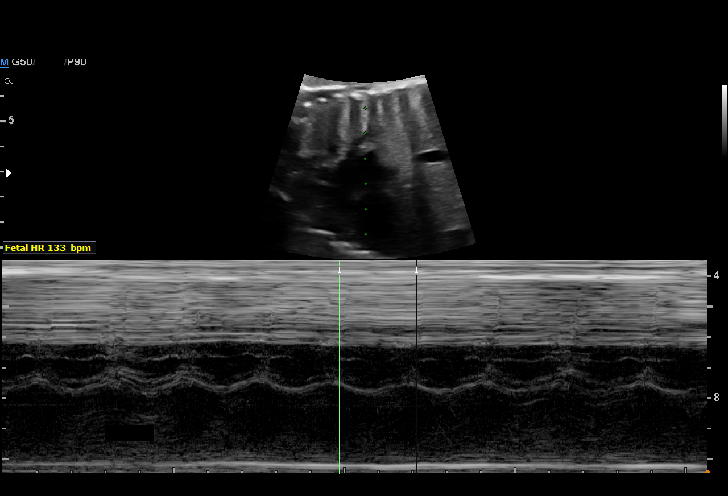
[im 44/70]
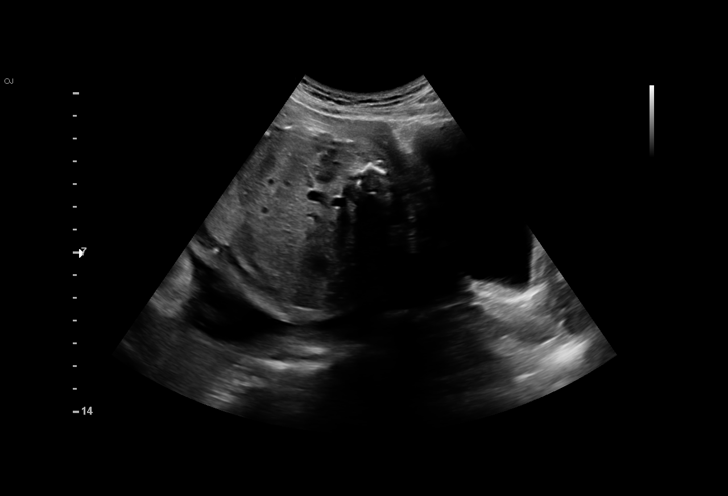
[im 49/70]
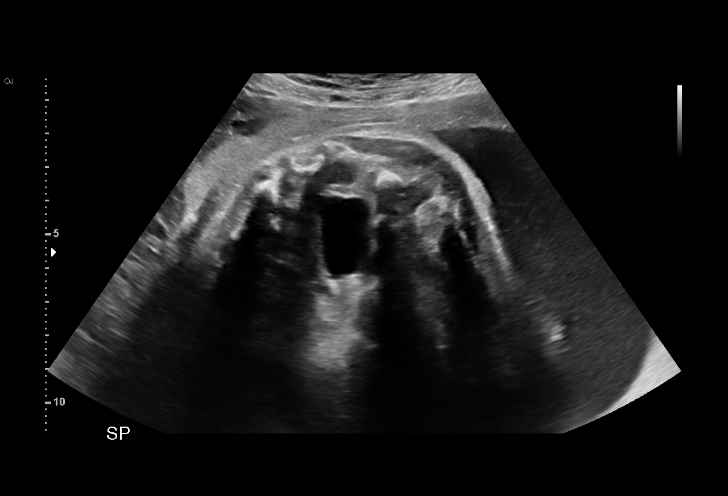
[im 54/70]
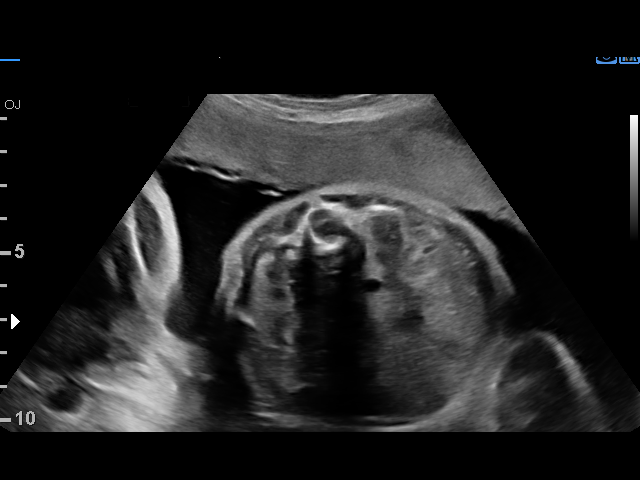
[im 59/70]
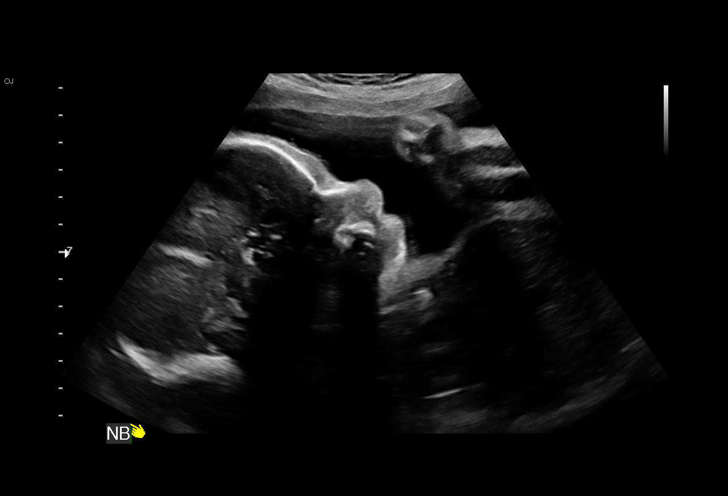
[im 64/70]
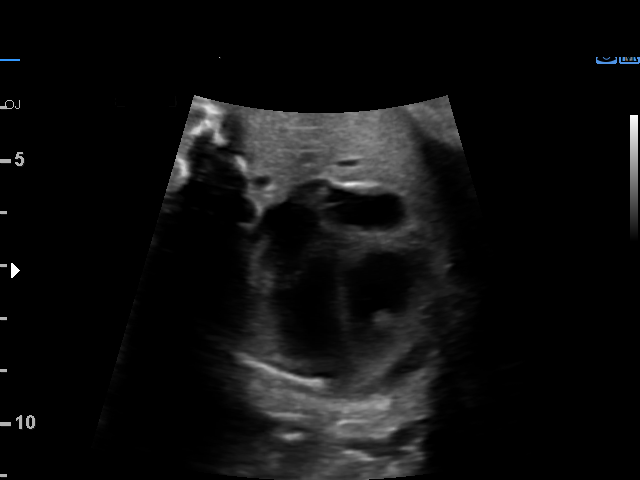
[im 70/70]
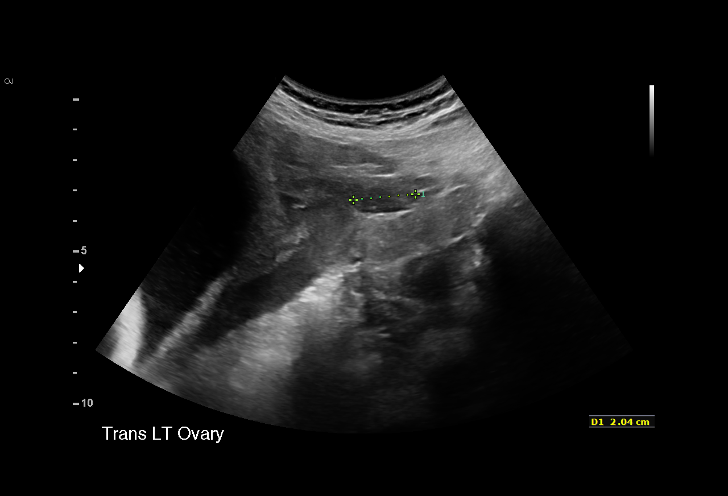

[14 of 28 positions shown; findings below may reference images not displayed]

Indications

Encounter for uncertain dates
Vaginal bleeding in pregnancy, third trimester
30 weeks gestation of pregnancy
OB History

Gravidity:    2         Term:   1        Prem:   0        SAB:   0
TOP:          0       Ectopic:  0        Living: 1
Fetal Evaluation

Num Of Fetuses:     1
Fetal Heart         133
Rate(bpm):
Cardiac Activity:   Observed
Presentation:       Breech
Placenta:           Anterior, above cervical os
P. Cord Insertion:  Visualized

Amniotic Fluid
AFI FV:      Subjectively within normal limits

AFI Sum(cm)     %Tile       Largest Pocket(cm)
19.41           74

RUQ(cm)       RLQ(cm)       LUQ(cm)        LLQ(cm)
3.74
Biometry

BPD:      76.7  mm     G. Age:  30w 5d         49  %    CI:          80.5  %    70 - 86
FL/HC:       21.5  %    19.2 -
HC:        270  mm     G. Age:  29w 3d          4  %    HC/AC:       0.99       0.99 -
AC:      272.2  mm     G. Age:  31w 2d         71  %    FL/BPD:      75.6  %    71 - 87
FL:         58  mm     G. Age:  30w 2d         33  %    FL/AC:       21.3  %    20 - 24
HUM:      52.5  mm     G. Age:  30w 4d         56  %
CER:      38.2  mm     G. Age:  32w 4d         81  %
CM:        8.7  mm

Est. FW:    3868   gm    3 lb 10 oz     60  %
Gestational Age

U/S Today:     30w 3d                                        EDD:   10/19/16
Best:          30w 3d     Det. By:  U/S (08/13/16)           EDD:   10/19/16
Anatomy

Cranium:               Appears normal         Aortic Arch:            Not well visualized
Cavum:                 Appears normal         Ductal Arch:            Appears normal
Ventricles:            Appears normal         Diaphragm:              Appears normal
Choroid Plexus:        Appears normal         Stomach:                Appears normal, left
sided
Cerebellum:            Appears normal         Abdomen:                Appears normal
Posterior Fossa:       Appears normal         Abdominal Wall:         Not well visualized
Nuchal Fold:           Not applicable (>20    Cord Vessels:           Appears normal (3
wks GA)                                        vessel cord)
Face:                  Orbits nl; profile not Kidneys:                Appear normal
well visualized
Lips:                  Appears normal         Bladder:                Appears normal
Thoracic:              Appears normal         Spine:                  Limited views
appear normal
Heart:                 Appears normal         Upper Extremities:      Appears normal
(4CH, axis, and situs
RVOT:                  Appears normal         Lower Extremities:      Appears normal
LVOT:                  Appears normal

Other:  Male gender. Technically difficult due to advanced GA and fetal
position.
Cervix Uterus Adnexa

Cervix
Not visualized (advanced GA >41wks)

Uterus
No abnormality visualized.

Left Ovary
Within normal limits.

Right Ovary
Not visualized. No adnexal mass visualized.
Impression

SIUP at 69w6d (remote read of ultrasound only)
active fetus
breech presentation
anterior placenta
no previa
EFW 60th%'le
no dysmorphic features
limited as noted above
amniotic fluid volume is gestational age appropriate
Recommendations

Management and follow up as clinically indicated.

## 2021-05-22 ENCOUNTER — Ambulatory Visit (HOSPITAL_COMMUNITY)
Admission: EM | Admit: 2021-05-22 | Discharge: 2021-05-23 | Disposition: A | Discharge status: Other Acute Inpatient Hospital | Payer: Medicaid Other | Attending: Family | Admitting: Family

## 2021-05-22 DIAGNOSIS — Z5986 Financial insecurity: Secondary | ICD-10-CM | POA: Insufficient documentation

## 2021-05-22 DIAGNOSIS — Z79899 Other long term (current) drug therapy: Secondary | ICD-10-CM | POA: Insufficient documentation

## 2021-05-22 DIAGNOSIS — F1090 Alcohol use, unspecified, uncomplicated: Secondary | ICD-10-CM | POA: Insufficient documentation

## 2021-05-22 DIAGNOSIS — O99345 Other mental disorders complicating the puerperium: Secondary | ICD-10-CM | POA: Insufficient documentation

## 2021-05-22 DIAGNOSIS — Z20822 Contact with and (suspected) exposure to covid-19: Secondary | ICD-10-CM | POA: Diagnosis not present

## 2021-05-22 DIAGNOSIS — Z630 Problems in relationship with spouse or partner: Secondary | ICD-10-CM | POA: Insufficient documentation

## 2021-05-22 DIAGNOSIS — R4587 Impulsiveness: Secondary | ICD-10-CM | POA: Diagnosis not present

## 2021-05-22 DIAGNOSIS — Z818 Family history of other mental and behavioral disorders: Secondary | ICD-10-CM | POA: Insufficient documentation

## 2021-05-22 DIAGNOSIS — R45851 Suicidal ideations: Secondary | ICD-10-CM | POA: Diagnosis not present

## 2021-05-22 DIAGNOSIS — F4321 Adjustment disorder with depressed mood: Secondary | ICD-10-CM | POA: Insufficient documentation

## 2021-05-22 DIAGNOSIS — Z91410 Personal history of adult physical and sexual abuse: Secondary | ICD-10-CM | POA: Insufficient documentation

## 2021-05-22 DIAGNOSIS — F319 Bipolar disorder, unspecified: Secondary | ICD-10-CM | POA: Insufficient documentation

## 2021-05-22 DIAGNOSIS — Z5989 Other problems related to housing and economic circumstances: Secondary | ICD-10-CM | POA: Insufficient documentation

## 2021-05-22 DIAGNOSIS — F53 Postpartum depression: Secondary | ICD-10-CM | POA: Insufficient documentation

## 2021-05-22 DIAGNOSIS — G4700 Insomnia, unspecified: Secondary | ICD-10-CM | POA: Insufficient documentation

## 2021-05-22 DIAGNOSIS — Z6379 Other stressful life events affecting family and household: Secondary | ICD-10-CM | POA: Insufficient documentation

## 2021-05-22 LAB — LIPID PANEL
Cholesterol: 140 mg/dL (ref 0–200)
HDL: 55 mg/dL (ref >40)
LDL Cholesterol: 64 mg/dL (ref 0–99)
Total CHOL/HDL Ratio: 2.5 RATIO
Triglycerides: 106 mg/dL (ref <150)
VLDL: 21 mg/dL (ref 0–40)

## 2021-05-22 LAB — CBC WITH DIFFERENTIAL/PLATELET
Abs Immature Granulocytes: 0.04 10*3/uL (ref 0.00–0.07)
Basophils Absolute: 0 10*3/uL (ref 0.0–0.1)
Basophils Relative: 0 %
Eosinophils Absolute: 0.2 10*3/uL (ref 0.0–0.5)
Eosinophils Relative: 1 %
HCT: 39 % (ref 36.0–46.0)
Hemoglobin: 11.8 g/dL — ABNORMAL LOW (ref 12.0–15.0)
Immature Granulocytes: 0 %
Lymphocytes Relative: 17 %
Lymphs Abs: 2.2 10*3/uL (ref 0.7–4.0)
MCH: 24.5 pg — ABNORMAL LOW (ref 26.0–34.0)
MCHC: 30.3 g/dL (ref 30.0–36.0)
MCV: 80.9 fL (ref 80.0–100.0)
Monocytes Absolute: 0.7 10*3/uL (ref 0.1–1.0)
Monocytes Relative: 5 %
Neutro Abs: 10 10*3/uL — ABNORMAL HIGH (ref 1.7–7.7)
Neutrophils Relative %: 77 %
Platelets: 380 10*3/uL (ref 150–400)
RBC: 4.82 MIL/uL (ref 3.87–5.11)
RDW: 15.4 % (ref 11.5–15.5)
WBC: 13.1 10*3/uL — ABNORMAL HIGH (ref 4.0–10.5)
nRBC: 0 % (ref 0.0–0.2)

## 2021-05-22 LAB — COMPREHENSIVE METABOLIC PANEL
ALT: 14 U/L (ref 0–44)
AST: 14 U/L — ABNORMAL LOW (ref 15–41)
Albumin: 4.2 g/dL (ref 3.5–5.0)
Alkaline Phosphatase: 80 U/L (ref 38–126)
Anion gap: 8 (ref 5–15)
BUN: 13 mg/dL (ref 6–20)
CO2: 27 mmol/L (ref 22–32)
Calcium: 9.6 mg/dL (ref 8.9–10.3)
Chloride: 104 mmol/L (ref 98–111)
Creatinine, Ser: 0.66 mg/dL (ref 0.44–1.00)
GFR, Estimated: >60 mL/min (ref >60)
Glucose, Bld: 85 mg/dL (ref 70–99)
Potassium: 4 mmol/L (ref 3.5–5.1)
Sodium: 139 mmol/L (ref 135–145)
Total Bilirubin: 0.5 mg/dL (ref 0.3–1.2)
Total Protein: 8 g/dL (ref 6.5–8.1)

## 2021-05-22 LAB — HEMOGLOBIN A1C
Hgb A1c MFr Bld: 4.7 % — ABNORMAL LOW (ref 4.8–5.6)
Mean Plasma Glucose: 88.19 mg/dL

## 2021-05-22 LAB — POC SARS CORONAVIRUS 2 AG -  ED: SARS Coronavirus 2 Ag: NEGATIVE

## 2021-05-22 LAB — POC SARS CORONAVIRUS 2 AG: SARSCOV2ONAVIRUS 2 AG: NEGATIVE

## 2021-05-22 LAB — TSH: TSH: 1.168 u[IU]/mL (ref 0.350–4.500)

## 2021-05-22 LAB — RESP PANEL BY RT-PCR (FLU A&B, COVID) ARPGX2
Influenza A by PCR: NEGATIVE
Influenza B by PCR: NEGATIVE
SARS Coronavirus 2 by RT PCR: NEGATIVE

## 2021-05-22 LAB — ETHANOL: Alcohol, Ethyl (B): <10 mg/dL (ref <10)

## 2021-05-22 LAB — MAGNESIUM: Magnesium: 1.9 mg/dL (ref 1.7–2.4)

## 2021-05-22 MED ORDER — MAGNESIUM HYDROXIDE 400 MG/5ML PO SUSP: 30.0000 mL | Freq: Every day | ORAL | Status: DC | PRN

## 2021-05-22 MED ORDER — ACETAMINOPHEN 325 MG PO TABS: 650.0000 mg | ORAL_TABLET | Freq: Four times a day (QID) | ORAL | Status: DC | PRN

## 2021-05-22 MED ORDER — PRENATAL MULTIVITAMIN CH
1.0000 | ORAL_TABLET | Freq: Every day | ORAL | Status: DC
  Administered 2021-05-23: 1 via ORAL
  Filled 2021-05-22: qty 1

## 2021-05-22 MED ORDER — ALUM & MAG HYDROXIDE-SIMETH 200-200-20 MG/5ML PO SUSP: 30.0000 mL | ORAL | Status: DC | PRN

## 2021-05-22 NOTE — ED Notes (Signed)
Pt is currently sleeping, no distress noted, environmental check complete, will continue to monitor patient for safety. ? ?

## 2021-05-22 NOTE — ED Notes (Signed)
Pt just came to the unit, she is sitting in the chair,she was also given a dinner meal

## 2021-05-22 NOTE — ED Notes (Signed)
Pt visibly upset due to being here. This nurse  and others explained to patient she is here to protect herself and others due to being IVC. PT went on to state that she is not suicidal or a harm to anyone else and never said she was. Pt was advised that she would be assessed by providers further to make a determination on continuity of care.
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
## 2021-05-22 NOTE — ED Notes (Signed)
Pt admitted to observation unit due to endorsing SI w/plan to jump in front of a train during triage assessment. Pt currently denies SI/HI/AVH. Pt preoccupied with religion during RN assessment. Pt speak in random about boyfriend who need to give his life to Jesus so the world would be a better place. Pt cooperative throughout assessment. Oriented to unit and unit rules. Will monitor for safety.
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
## 2021-05-22 NOTE — ED Notes (Signed)
Medication was offered to help patient calm down she stated she did not want any medications
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
## 2021-05-23 ENCOUNTER — Encounter (HOSPITAL_COMMUNITY): Payer: Self-pay | Admitting: Psychiatry

## 2021-05-23 ENCOUNTER — Inpatient Hospital Stay (HOSPITAL_COMMUNITY)
Admission: AD | Admit: 2021-05-23 | Discharge: 2021-05-29 | DRG: 885 | Disposition: A | Discharge status: Home / Self Care | Payer: Medicaid Other | Source: Intra-hospital | Attending: Psychiatry | Admitting: Psychiatry

## 2021-05-23 ENCOUNTER — Other Ambulatory Visit: Payer: Self-pay

## 2021-05-23 DIAGNOSIS — D649 Anemia, unspecified: Secondary | ICD-10-CM | POA: Diagnosis present

## 2021-05-23 DIAGNOSIS — F329 Major depressive disorder, single episode, unspecified: Secondary | ICD-10-CM | POA: Diagnosis present

## 2021-05-23 DIAGNOSIS — I10 Essential (primary) hypertension: Secondary | ICD-10-CM | POA: Diagnosis present

## 2021-05-23 DIAGNOSIS — Z91040 Latex allergy status: Secondary | ICD-10-CM | POA: Diagnosis not present

## 2021-05-23 DIAGNOSIS — Z91411 Personal history of adult psychological abuse: Secondary | ICD-10-CM

## 2021-05-23 DIAGNOSIS — Z20822 Contact with and (suspected) exposure to covid-19: Secondary | ICD-10-CM | POA: Diagnosis present

## 2021-05-23 DIAGNOSIS — Z91410 Personal history of adult physical and sexual abuse: Secondary | ICD-10-CM

## 2021-05-23 DIAGNOSIS — N390 Urinary tract infection, site not specified: Secondary | ICD-10-CM | POA: Diagnosis present

## 2021-05-23 DIAGNOSIS — F17210 Nicotine dependence, cigarettes, uncomplicated: Secondary | ICD-10-CM | POA: Diagnosis present

## 2021-05-23 DIAGNOSIS — E559 Vitamin D deficiency, unspecified: Secondary | ICD-10-CM | POA: Diagnosis present

## 2021-05-23 DIAGNOSIS — F4323 Adjustment disorder with mixed anxiety and depressed mood: Secondary | ICD-10-CM

## 2021-05-23 DIAGNOSIS — F515 Nightmare disorder: Secondary | ICD-10-CM | POA: Diagnosis not present

## 2021-05-23 DIAGNOSIS — G4700 Insomnia, unspecified: Secondary | ICD-10-CM | POA: Diagnosis present

## 2021-05-23 DIAGNOSIS — F909 Attention-deficit hyperactivity disorder, unspecified type: Secondary | ICD-10-CM | POA: Diagnosis present

## 2021-05-23 DIAGNOSIS — F411 Generalized anxiety disorder: Secondary | ICD-10-CM | POA: Diagnosis present

## 2021-05-23 DIAGNOSIS — R45851 Suicidal ideations: Secondary | ICD-10-CM | POA: Diagnosis present

## 2021-05-23 DIAGNOSIS — F332 Major depressive disorder, recurrent severe without psychotic features: Secondary | ICD-10-CM | POA: Diagnosis present

## 2021-05-23 LAB — POCT URINE DRUG SCREEN - MANUAL ENTRY (I-SCREEN)
POC Amphetamine UR: NOT DETECTED
POC Buprenorphine (BUP): NOT DETECTED
POC Cocaine UR: NOT DETECTED
POC Marijuana UR: NOT DETECTED
POC Methadone UR: NOT DETECTED
POC Methamphetamine UR: NOT DETECTED
POC Morphine: NOT DETECTED
POC Oxazepam (BZO): NOT DETECTED
POC Oxycodone UR: NOT DETECTED
POC Secobarbital (BAR): NOT DETECTED

## 2021-05-23 LAB — POCT PREGNANCY, URINE: Preg Test, Ur: NEGATIVE

## 2021-05-23 MED ORDER — ACETAMINOPHEN 325 MG PO TABS
650.0000 mg | ORAL_TABLET | Freq: Four times a day (QID) | ORAL | Status: DC | PRN
  Administered 2021-05-25 – 2021-05-28 (×3): 650 mg via ORAL
  Filled 2021-05-23 (×3): qty 2

## 2021-05-23 MED ORDER — ALUM & MAG HYDROXIDE-SIMETH 200-200-20 MG/5ML PO SUSP: 30.0000 mL | ORAL | Status: DC | PRN

## 2021-05-23 MED ORDER — HYDROXYZINE HCL 25 MG PO TABS
25.0000 mg | ORAL_TABLET | Freq: Three times a day (TID) | ORAL | Status: DC | PRN
  Filled 2021-05-23: qty 1

## 2021-05-23 MED ORDER — MAGNESIUM HYDROXIDE 400 MG/5ML PO SUSP: 30.0000 mL | Freq: Every day | ORAL | Status: DC | PRN

## 2021-05-23 MED ORDER — TRAZODONE HCL 50 MG PO TABS
50.0000 mg | ORAL_TABLET | Freq: Every evening | ORAL | Status: DC | PRN
  Filled 2021-05-23: qty 1

## 2021-05-23 NOTE — ED Notes (Signed)
Pt sleeping@this time. Breathing even and unlabored. Will continue to monitor for safety 

## 2021-05-23 NOTE — Progress Notes (Signed)
Patient is a 26 year old female who presented under IVC from the [facility] for making suicidal statements. Per mother, pt had a plan to kill herself by either walking in front of a train or into traffic. Pt reported that her statements were taken out of context, and that she wasn't suicidal. Pt does endorse depression,poor sleep and poor appetite. Patient presented calm and cooperative, made good eye contact, and answered questions logically and coherently during admission interview and assessment. VS monitored and recorded. Skin assessment performed. Belongings searched and secured in locker.Admission paperwork completed  Patient was oriented to unit and schedule. Pt currently denies SI/HI and A/VH, and does not appear to be responding to internal stimuli. Dinner and po fluids provided. Q 15 min checks initiated for safety/ .

## 2021-05-23 NOTE — ED Notes (Signed)
Pt is currently sleeping, no distress noted, environmental check complete, will continue to monitor patient for safety. ? ?

## 2021-05-23 NOTE — Tx Team (Signed)
Initial Treatment Plan 05/23/2021 7:59 PM Dawn Levy [id number]    PATIENT STRESSORS: Financial difficulties   Loss of relationship   Traumatic event     PATIENT STRENGTHS: Ability for insight  Average or above average intelligence  Communication skills  Motivation for treatment/growth  Physical Health  Supportive family/friends    PATIENT IDENTIFIED PROBLEMS: Suicidal ideation    Poor sleep    depression             DISCHARGE CRITERIA:  Adequate post-discharge living arrangements Improved stabilization in mood, thinking, and/or behavior Reduction of life-threatening or endangering symptoms to within safe limits Verbal commitment to aftercare and medication compliance  PRELIMINARY DISCHARGE PLAN: Outpatient therapy Return to previous living arrangement  PATIENT/FAMILY INVOLVEMENT: This treatment plan has been presented to and reviewed with the patient, Dawn Levy, The patient has been given the opportunity to ask questions and make suggestions.  Shela Nevin, RN 05/23/2021, 7:59 PM

## 2021-05-23 NOTE — Progress Notes (Signed)
CSW followed-up with [place] with [facility] health in reference to a referral sent for this patient. It was reported that intake is requesting IVC paperwork and a pregnancy result be sent to fax number [phone] for further review.    Glennie Isle, MSW, [place], LCAS-A Phone: [phone] Disposition/TOC

## 2021-05-23 NOTE — Progress Notes (Signed)
°   05/23/21 2000  Psych Admission Type (Psych Patients Only)  Admission Status Voluntary  Psychosocial Assessment  Patient Complaints None  Eye Contact Brief  Facial Expression Sad  Affect Sad  Speech Logical/coherent  Interaction Needy  Motor Activity Slow  Appearance/Hygiene Unremarkable  Behavior Characteristics Cooperative  Mood Pleasant  Aggressive Behavior  Effect No apparent injury  Thought Process  Coherency WDL  Content WDL  Delusions WDL  Perception WDL  Hallucination None reported or observed  Judgment WDL  Confusion None  Danger to Self  Current suicidal ideation? Denies  Danger to Others  Danger to Others None reported or observed

## 2021-05-23 NOTE — Progress Notes (Signed)
Luke with [facility] contacted CSW in reference to referral sent for placement. It was reported that the patient will be further reviewed by the intake nurse.  ° °Abdulrahim Siddiqi, MSW, LCSW-A, LCAS-A °Phone: [phone] °Disposition/TOC ° °

## 2021-05-23 NOTE — ED Notes (Signed)
Pt has been on and off the phone at various times today. No adverse behaviors with phone calls. Accepted lunch earlier and afternoon scheduled meds. Scrubs unable to be provided d/t none available in her size. Staff encouraged Pt to call someone to bring her clean clothes to change with. Safety maintained and will continue to monitor.

## 2021-05-23 NOTE — ED Notes (Signed)
Report called to Mateo Flow, RN at [facility] and [employer] called for transportation to [facility]. IVC paperwork previously faxed and confirmation received. Three copies of IVC paperwork placed in envelope to send with Pt to [facility] along with the EMTALA. Pt aware of transfer. Safety maintained and will continue to monitor until d/c from facility.

## 2021-05-23 NOTE — Progress Notes (Signed)
Luke with [facility] followed up on referral faxed for possible placement at [facility]. It was reported that the intake nurse will further review the patient.  Glennie Isle, MSW, [place], LCAS-A Phone: [phone] Disposition/TOC

## 2021-05-23 NOTE — Progress Notes (Incomplete)
Jamsine with, [facility] intake, contacted CSW in reference to referral sent for this patient in regards to

## 2021-05-23 NOTE — ED Notes (Signed)
Patient refused Breakfast

## 2021-05-23 NOTE — Discharge Instructions (Addendum)
Transfer to [facility] For IP admission  
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
## 2021-05-23 NOTE — Progress Notes (Signed)
Inpatient Behavioral Health Placement   Pt meets inpatient criteria per Beatriz Stallion, NP. There are no appropriate beds per [place], RN.  Referral was sent to the following facilities;   Destination Service Provider Address Phone [facility]  [address] [place] [address] [phone] [facility]  [address] [place] [address] [phone] [place], [place] [id number] [phone]  [facility]  [place], [place] [address] [place] [address] [phone]  [facility]  [place], [place] [address] [phone] [facility]  [address] [place], [place] [address] [phone] [facility]  [address] [place] [address] [phone] [facility]  [address] [place] [address] [phone]  [facility]  [address], [place] [address] [phone] [place], [place] [address] [id number] [facility]  [address], [place] [address] [phone]  [facility]  [address] [place] [address] [phone] [facility]  [address] [place] [address] [place]  [facility]  [place], [place] [address] [phone]  [facility]  [address], [place] [address] [phone]  [facility]  [address] [place]  [address]      Situation ongoing,  CSW will follow up.   Benjaman Kindler, MSW, LCSWA 05/23/2021  @ 1:37 AM

## 2021-05-23 NOTE — Progress Notes (Signed)
[facility] Group Notes:  (Nursing/MHT/Case Management/Adjunct) ° °Date:  05/23/2021  °Time:  2015 ° °Type of Therapy:   wrap up group ° °Participation Level:  Active ° °Participation Quality:  Appropriate, Attentive, Sharing, and Supportive ° °Affect:  Appropriate ° °Cognitive:  Alert ° °Insight:  Improving ° °Engagement in Group:  Engaged ° °Modes of Intervention:  Clarification, Education, and Support ° °Summary of Progress/Problems: Positive thinking and positive change were discussed.  ° °Elky Funches S °05/23/2021, 8:48 PM °

## 2021-05-23 NOTE — Progress Notes (Signed)
Luke with [facility] followed up on referral faxed for possible placement at [facility]. It was reported that the intake nurse will further review the patient.  Glennie [place], MSW, [place], LCAS-A Phone: [phone] Disposition/TOC

## 2021-05-23 NOTE — ED Notes (Signed)
Discharge instructions provided and Pt stated understanding. Pt alert, orient and ambulatory prior to d/c from facility. Personal belongings returned from locker number 9. [employer] called for transportation services to [facility] Pt escorted to the sally port. Safety maintained.

## 2021-05-24 DIAGNOSIS — F4323 Adjustment disorder with mixed anxiety and depressed mood: Secondary | ICD-10-CM

## 2021-05-24 DIAGNOSIS — F332 Major depressive disorder, recurrent severe without psychotic features: Secondary | ICD-10-CM

## 2021-05-24 LAB — URINALYSIS, ROUTINE W REFLEX MICROSCOPIC
Bilirubin Urine: NEGATIVE
Glucose, UA: NEGATIVE mg/dL
Hgb urine dipstick: NEGATIVE
Ketones, ur: NEGATIVE mg/dL
Nitrite: POSITIVE — AB
Protein, ur: NEGATIVE mg/dL
Specific Gravity, Urine: 1.014 (ref 1.005–1.030)
pH: 7 (ref 5.0–8.0)

## 2021-05-24 NOTE — [facility] Group Notes (Signed)
Pt attended goals group and participated in discussion. 
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
## 2021-05-24 NOTE — Progress Notes (Signed)
D. Pt presented with a sad affect/ depressed mood- rated her depression, hopelessness and anxiety a 8/0/0, respectively. Pt reported that her goal today was "to work on my feelings", and "control how I feel around others." Pt reported that she just wants to work on her feelings so that she doesn't upset others with her emotions. Pt has been visible in the milieu throughout the shift, and observed attending groups. Pt currently denies SI/HI and AVH  A. Labs and vitals monitored.  Pt supported emotionally and encouraged to express concerns and ask questions.   R. Pt remains safe with 15 minute checks. Will continue POC.

## 2021-05-24 NOTE — [facility] Group Notes (Signed)
Adult Psychoeducational Group Not °Date:  05/24/2021 °Time:  0900-1045 °Group Topic/Focus: PROGRESSIVE RELAXATION. A group where deep breathing is taught and tensing and relaxation muscle groups is used. Imagery is used as well.  Pts are asked to imagine 3 pillars that hold them up when they are not able to hold themselves up. ° °Participation Level: did not attend ° °Corina Stacy A ° ° °
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
## 2021-05-24 NOTE — Group Note (Signed)
LCSW Group Therapy Note ° ° °Group Date: 05/24/2021 °Start Time: 1000 °End Time: 1100 ° ° °Type of Therapy and Topic:  Group Therapy:  ° °LCSW Group Therapy °  °  °Due to high patient acuity and staffing, group was unable to be held on 05/24/2021. The CSW supervisor as well as the on-duty AC was made aware. ° ° ° °Cobi Aldape A Myah Guynes, LCSW °05/24/2021  12:30 PM   ° °

## 2021-05-24 NOTE — Progress Notes (Signed)
°   05/24/21 0545  Sleep  Number of Hours 5.75

## 2021-05-24 NOTE — [facility] Group Notes (Signed)
Adult Psychoeducational Group Not Date:  05/24/2021 Time:  3762-8315 Group Topic/Focus: PROGRESSIVE RELAXATION. A group where deep breathing is taught and tensing and relaxation muscle groups is used. Imagery is used as well.  Pts are asked to imagine 3 pillars that hold them up when they are not able to hold themselves up.  Participation Level:  Active  Participation Quality:  Appropriate  Affect:  Appropriate  Cognitive:  Oriented  Insight: Improving  Engagement in Group:  Engaged  Modes of Intervention:  Activity, Discussion, Education, and Support  Additional Comments:  Rates energy at a 10/10. Participated fully in the group, giving and receiving feedback.  Dione Housekeeper

## 2021-05-24 NOTE — Progress Notes (Signed)
Patient was given UA container per MD order. ° °
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
## 2021-05-24 NOTE — Progress Notes (Addendum)
Patient needs prenatal vitamins for her hair, skin, nails.  Trying to still breast pump for newborn child.  She has cleft pallete, who will be having several surgeries.  It is important to pump milk for her.  Patient is anemic and needs iron pill.  She takes both prenatal vitamin and iron pill.   Four children, ages 39, 30, 1, and 3 months.  No help at home.  Feels she has to take care of her children by herself. Feels she is easy to talk to but does not like to argue with anyone.  Does not like drama.

## 2021-05-25 ENCOUNTER — Encounter (HOSPITAL_COMMUNITY): Payer: Self-pay

## 2021-05-25 DIAGNOSIS — N390 Urinary tract infection, site not specified: Secondary | ICD-10-CM | POA: Diagnosis present

## 2021-05-25 LAB — CBC WITH DIFFERENTIAL/PLATELET
Abs Immature Granulocytes: 0.03 10*3/uL (ref 0.00–0.07)
Basophils Absolute: 0 10*3/uL (ref 0.0–0.1)
Basophils Relative: 0 %
Eosinophils Absolute: 0.1 10*3/uL (ref 0.0–0.5)
Eosinophils Relative: 2 %
HCT: 38.6 % (ref 36.0–46.0)
Hemoglobin: 11.7 g/dL — ABNORMAL LOW (ref 12.0–15.0)
Immature Granulocytes: 0 %
Lymphocytes Relative: 31 %
Lymphs Abs: 2.4 10*3/uL (ref 0.7–4.0)
MCH: 24.7 pg — ABNORMAL LOW (ref 26.0–34.0)
MCHC: 30.3 g/dL (ref 30.0–36.0)
MCV: 81.4 fL (ref 80.0–100.0)
Monocytes Absolute: 0.6 10*3/uL (ref 0.1–1.0)
Monocytes Relative: 7 %
Neutro Abs: 4.7 10*3/uL (ref 1.7–7.7)
Neutrophils Relative %: 60 %
Platelets: 344 10*3/uL (ref 150–400)
RBC: 4.74 MIL/uL (ref 3.87–5.11)
RDW: 15.4 % (ref 11.5–15.5)
WBC: 7.8 10*3/uL (ref 4.0–10.5)
nRBC: 0 % (ref 0.0–0.2)

## 2021-05-25 LAB — TSH: TSH: 2.187 u[IU]/mL (ref 0.350–4.500)

## 2021-05-25 MED ORDER — COMPLETENATE 29-1 MG PO CHEW: 1.0000 | CHEWABLE_TABLET | Freq: Every day | ORAL | Status: DC

## 2021-05-25 MED ORDER — NITROFURANTOIN MONOHYD MACRO 100 MG PO CAPS
100.0000 mg | ORAL_CAPSULE | Freq: Two times a day (BID) | ORAL | Status: DC
  Administered 2021-05-25 – 2021-05-29 (×9): 100 mg via ORAL
  Filled 2021-05-25 (×12): qty 1

## 2021-05-25 MED ORDER — PRENATAL MULTIVITAMIN CH
1.0000 | ORAL_TABLET | Freq: Every day | ORAL | Status: DC
  Administered 2021-05-26 – 2021-05-29 (×4): 1 via ORAL
  Filled 2021-05-25 (×6): qty 1

## 2021-05-25 NOTE — Progress Notes (Signed)
° ° °   05/24/21 2145  Psych Admission Type (Psych Patients Only)  Admission Status Voluntary  Psychosocial Assessment  Patient Complaints Anxiety  Eye Contact Brief  Facial Expression Anxious  Affect Appropriate to circumstance  Speech Logical/coherent  Interaction Assertive  Motor Activity Other (Comment) (steady)  Appearance/Hygiene Disheveled  Behavior Characteristics Cooperative;Calm  Mood Pleasant;Anxious  Thought Process  Coherency WDL  Content WDL  Delusions WDL  Perception WDL  Hallucination None reported or observed  Judgment WDL  Confusion None  Danger to Self  Current suicidal ideation? Denies  Self-Injurious Behavior No self-injurious ideation or behavior indicators observed or expressed   Agreement Not to Harm Self Yes  Description of Agreement verbal contract for safety  Danger to Others  Danger to Others None reported or observed

## 2021-05-25 NOTE — Progress Notes (Signed)
°   05/25/21 2000  Psych Admission Type (Psych Patients Only)  Admission Status Voluntary  Psychosocial Assessment  Patient Complaints Anxiety  Eye Contact Brief  Facial Expression Anxious  Affect Appropriate to circumstance  Speech Logical/coherent  Interaction Assertive  Motor Activity Slow  Appearance/Hygiene Disheveled  Behavior Characteristics Cooperative;Calm  Mood Depressed  Aggressive Behavior  Effect No apparent injury  Thought Process  Coherency WDL  Content WDL  Delusions WDL  Perception WDL  Hallucination None reported or observed  Judgment WDL  Confusion WDL  Danger to Self  Current suicidal ideation? Denies  Danger to Others  Danger to Others None reported or observed

## 2021-05-25 NOTE — Group Note (Signed)
Recreation Therapy Group Note   Group Topic:Stress Management  Group Date: 05/25/2021 Start Time: 0930 End Time: 0950 Facilitators: Caroll Rancher, [place] Location: [address] Dayroom   Goal Area(s) Addresses:  Patient will identify positive stress management techniques. Patient will identify benefits of using stress management post d/c.  Group Description:  Stress Release.  LRT played a meditation that focused on releasing stress through breathing and muscle tension and release.  Patients were to follow along as meditation played and go through the steps as the speaker was leading them through.  LRT and patients also discussed places like apps and Youtube  to access other stress management techniques.   Affect/Mood: Appropriate   Participation Level: Active   Participation Quality: Independent   Behavior: Appropriate   Speech/Thought Process: Focused   Insight: Good   Judgement: Good   Modes of Intervention: Meditation   Patient Response to Interventions:  Attentive   Education Outcome:  Acknowledges education and In group clarification offered    Clinical Observations/Individualized Feedback: Pt attended and participated in group.    Plan: Continue to engage patient in RT group sessions 2-3x/week.   Caroll Rancher, Antonietta Jewel 05/25/2021 12:59 PM

## 2021-05-25 NOTE — [facility] Suicide Risk Assessment (Addendum)
[facility] INPATIENT:  Family/Significant Other Suicide Prevention Education  Suicide Prevention Education:  Contact Attempts: mother Dawn Levy [phone]), (name of family member/significant other) has been identified by the patient as the family member/significant other with whom the patient will be residing, and identified as the person(s) who will aid the patient in the event of a mental health crisis.  With written consent from the patient, two attempts were made to provide suicide prevention education, prior to and/or following the patient's discharge.  We were unsuccessful in providing suicide prevention education.  A suicide education pamphlet was given to the patient to share with family/significant other.  Date and time of first attempt:05/25/2021  /  3:13pm CSW unable to leave voicemail due to mailbox being full Date and time of second attempt: 05/27/2021   /   10:28am CSW unable to leave voicemail due to mailbox being full  Dawn Levy 05/25/2021, 3:14 PM

## 2021-05-25 NOTE — Group Note (Deleted)
LCSW Group Therapy Note ° ° °Group Date: 05/25/2021 °Start Time: 1300 °End Time: 1400 ° ° °Type of Therapy and Topic:  Group Therapy:  ° °Participation Level:  {[facility] PARTICIPATION LEVEL:22264} ° °Description of Group: ° ° °Therapeutic Goals: ° °1.   ° ° °Summary of Patient Progress:   ° °*** ° °Therapeutic Modalities:  ° °Kayona Foor M Jasani Lengel, LCSWA °05/25/2021  1:24 PM   ° °

## 2021-05-25 NOTE — Progress Notes (Signed)
D:  Patient's self inventory sheet, patient sleeps good, no sleep medicine.  Fair appetite, normal energy level, good concentration.  Denied depression, hopeless and anxiety.  Denied withdrawals.  Denied SI.  Denied physical problems.  Physical pain, worst pain #8 in past 24 hours, cramps.  Goal is emotions.   Plans to think before I react.  No discharge plans. A:  Medications administered per MD orders.  Emotional support and encouragement given patient. R:  Denied SI and HI, contracts for safety.  Denied A/V hallucinations.  Safety maintained with 15 minute checks.

## 2021-05-25 NOTE — BH IP Treatment Plan (Signed)
Interdisciplinary Treatment and Diagnostic Plan Update  05/25/2021 Time of Session: 9:10am Dawn Levy MRN: [id number]  Principal Diagnosis: MDD (major depressive disorder), recurrent severe, without psychosis ([place])  Secondary Diagnoses: Principal Problem:   MDD (major depressive disorder), recurrent severe, without psychosis ([place]) Active Problems:   MDD (major depressive disorder)   Adjustment disorder with mixed anxiety and depressed mood   Current Medications:  Current Facility-Administered Medications  Medication Dose Route Frequency Provider Last Rate Last Admin   acetaminophen (TYLENOL) tablet 650 mg  650 mg Oral Q6H PRN Derrill Center, NP       alum & mag hydroxide-simeth (MAALOX/MYLANTA) 200-200-20 MG/5ML suspension 30 mL  30 mL Oral Q4H PRN Derrill Center, NP       hydrOXYzine (ATARAX) tablet 25 mg  25 mg Oral TID PRN Derrill Center, NP       magnesium hydroxide (MILK OF MAGNESIA) suspension 30 mL  30 mL Oral Daily PRN Derrill Center, NP       traZODone (DESYREL) tablet 50 mg  50 mg Oral QHS PRN Derrill Center, NP       PTA Medications: Medications Prior to Admission  Medication Sig Dispense Refill Last Dose   ferrous sulfate 325 (65 FE) MG tablet Take 325 mg by mouth daily with breakfast.      NIFEdipine (ADALAT CC) 30 MG 24 hr tablet Take 30 mg by mouth daily. Has not taken in the past 2 months because of barriers, but does need to resume on this medication      Prenatal Vit-Fe Fumarate-FA (MULTIVITAMIN-PRENATAL) 27-0.8 MG TABS tablet Take 1 tablet by mouth daily.       Patient Stressors: Financial difficulties   Loss of relationship   Traumatic event    Patient Strengths: Ability for insight  Average or above average intelligence  Communication skills  Motivation for treatment/growth  Physical Health  Supportive family/friends   Treatment Modalities: Medication Management, Group therapy, Case management,  1 to 1 session with clinician, Psychoeducation,  Recreational therapy.   Physician Treatment Plan for Primary Diagnosis: MDD (major depressive disorder), recurrent severe, without psychosis ([place]) Long Term Goal(s): Improvement in symptoms so as ready for discharge   Short Term Goals: Ability to identify changes in lifestyle to reduce recurrence of condition will improve Ability to verbalize feelings will improve Ability to disclose and discuss suicidal ideas Ability to demonstrate self-control will improve Ability to identify and develop effective coping behaviors will improve Ability to maintain clinical measurements within normal limits will improve Ability to identify triggers associated with substance abuse/mental health issues will improve  Medication Management: Evaluate patient's response, side effects, and tolerance of medication regimen.  Therapeutic Interventions: 1 to 1 sessions, Unit Group sessions and Medication administration.  Evaluation of Outcomes: Not Met  Physician Treatment Plan for Secondary Diagnosis: Principal Problem:   MDD (major depressive disorder), recurrent severe, without psychosis ([place]) Active Problems:   MDD (major depressive disorder)   Adjustment disorder with mixed anxiety and depressed mood  Long Term Goal(s): Improvement in symptoms so as ready for discharge   Short Term Goals: Ability to identify changes in lifestyle to reduce recurrence of condition will improve Ability to verbalize feelings will improve Ability to disclose and discuss suicidal ideas Ability to demonstrate self-control will improve Ability to identify and develop effective coping behaviors will improve Ability to maintain clinical measurements within normal limits will improve Ability to identify triggers associated with substance abuse/mental health issues will improve  Medication Management: Evaluate patient's response, side effects, and tolerance of medication regimen.  Therapeutic Interventions: 1 to 1 sessions,  Unit Group sessions and Medication administration.  Evaluation of Outcomes: Not Met   RN Treatment Plan for Primary Diagnosis: MDD (major depressive disorder), recurrent severe, without psychosis ([place]) Long Term Goal(s): Knowledge of disease and therapeutic regimen to maintain health will improve  Short Term Goals: Ability to remain free from injury will improve, Ability to verbalize frustration and anger appropriately will improve, Ability to demonstrate self-control, Ability to participate in decision making will improve, Ability to verbalize feelings will improve, Ability to identify and develop effective coping behaviors will improve, and Compliance with prescribed medications will improve  Medication Management: RN will administer medications as ordered by provider, will assess and evaluate patient's response and provide education to patient for prescribed medication. RN will report any adverse and/or side effects to prescribing provider.  Therapeutic Interventions: 1 on 1 counseling sessions, Psychoeducation, Medication administration, Evaluate responses to treatment, Monitor vital signs and CBGs as ordered, Perform/monitor CIWA, COWS, AIMS and Fall Risk screenings as ordered, Perform wound care treatments as ordered.  Evaluation of Outcomes: Not Met   LCSW Treatment Plan for Primary Diagnosis: MDD (major depressive disorder), recurrent severe, without psychosis ([place]) Long Term Goal(s): Safe transition to appropriate next level of care at discharge, Engage patient in therapeutic group addressing interpersonal concerns.  Short Term Goals: Engage patient in aftercare planning with referrals and resources, Increase social support, Increase ability to appropriately verbalize feelings, Increase emotional regulation, Identify triggers associated with mental health/substance abuse issues, and Increase skills for wellness and recovery  Therapeutic Interventions: Assess for all discharge needs, 1  to 1 time with Social worker, Explore available resources and support systems, Assess for adequacy in community support network, Educate family and significant other(s) on suicide prevention, Complete Psychosocial Assessment, Interpersonal group therapy.  Evaluation of Outcomes: Not Met   Progress in Treatment: Attending groups: Yes. Participating in groups: Yes. Taking medication as prescribed: No. Toleration medication: No. Family/Significant other contact made: No, will contact:  mother Patient understands diagnosis: No. Discussing patient identified problems/goals with staff: Yes. Medical problems stabilized or resolved: Yes. Denies suicidal/homicidal ideation: Yes. Issues/concerns per patient self-inventory: No.   New problem(s) identified: No, Describe:  none  New Short Term/Long Term Goal(s): medication stabilization, elimination of SI thoughts, development of comprehensive mental wellness plan.    Patient Goals: "To stay away from people who are toxic and causing my depression. To set boundaries"  Discharge Plan or Barriers: Patient recently admitted. CSW will continue to follow and assess for appropriate referrals and possible discharge planning.    Reason for Continuation of Hospitalization: Depression Medication stabilization Suicidal ideation  Estimated Length of Stay: 3-5 days   Scribe for Treatment Team: Vassie Moselle, LCSW 05/25/2021 10:29 AM

## 2021-05-25 NOTE — Group Note (Signed)
LCSW Group Therapy Note  Group Date: 05/25/2021 Start Time: 1300 End Time: 1400   Type of Therapy and Topic:  Group Therapy: Positive Affirmations  Participation Level:  Active   Description of Group:   This group addressed positive affirmation towards self and others.  Patients went around the room and identified two positive things about themselves and two positive things about a peer in the room.  Patients reflected on how it felt to share something positive with others, to identify positive things about themselves, and to hear positive things from others/ Patients were encouraged to have a daily reflection of positive characteristics or circumstances.   Therapeutic Goals: Patients will verbalize two of their positive qualities Patients will demonstrate empathy for others by stating two positive qualities about a peer in the group Patients will verbalize their feelings when voicing positive self affirmations and when voicing positive affirmations of others Patients will discuss the potential positive impact on their wellness/recovery of focusing on positive traits of self and others.  Summary of Patient Progress:  Pt actively engaged in the discussion and . She was able to identify positive affirmations about herself as well as other group members. Patient demonstrated insight into the subject matter, was respectful of peers, participated throughout the entire session.  Therapeutic Modalities:   Cognitive Behavioral Therapy Motivational Interviewing    Dawn Levy 05/25/2021  1:39 PM

## 2021-05-25 NOTE — Plan of Care (Signed)
Nurse discussed coping skills with patient.  
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
## 2021-05-25 NOTE — Plan of Care (Signed)
   Problem: Education: Goal: Emotional status will improve Outcome: Progressing Goal: Mental status will improve Outcome: Progressing   Problem: Coping: Goal: Ability to demonstrate self-control will improve Outcome: Progressing   

## 2021-05-25 NOTE — Progress Notes (Signed)
The patient attended the evening A.A.meeting and was appropriate.  
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
## 2021-05-25 NOTE — [facility] Group Notes (Signed)
Orientation Group and Psychoeducational teaching. Patients were asked to share how they are feeling mentally today, and unit and ward rules, expectations were discussed. Psychoeducational teaching was then discussed with habits of mindfulness and positive reframing techniques to improve mental health. The patients were given a poem of the Dalia Lama and asked one technique to use to help calm anxiety and negative thoughts. The patient attended group.  °

## 2021-05-26 ENCOUNTER — Encounter (HOSPITAL_COMMUNITY): Payer: Self-pay | Admitting: Psychiatry

## 2021-05-26 LAB — RPR: RPR Ser Ql: NONREACTIVE

## 2021-05-26 LAB — VITAMIN B12: Vitamin B-12: 244 pg/mL (ref 180–914)

## 2021-05-26 LAB — VITAMIN D 25 HYDROXY (VIT D DEFICIENCY, FRACTURES): Vit D, 25-Hydroxy: 13.33 ng/mL — ABNORMAL LOW (ref 30–100)

## 2021-05-26 MED ORDER — ENSURE ENLIVE PO LIQD
237.0000 mL | Freq: Two times a day (BID) | ORAL | Status: DC
  Administered 2021-05-26 – 2021-05-29 (×4): 237 mL via ORAL
  Filled 2021-05-26 (×10): qty 237

## 2021-05-26 MED ORDER — FLUOXETINE HCL 10 MG PO CAPS
10.0000 mg | ORAL_CAPSULE | Freq: Every day | ORAL | Status: DC
  Administered 2021-05-26 – 2021-05-28 (×3): 10 mg via ORAL
  Filled 2021-05-26 (×6): qty 1

## 2021-05-26 MED ORDER — NICOTINE POLACRILEX 2 MG MT GUM
2.0000 mg | CHEWING_GUM | OROMUCOSAL | Status: DC | PRN
  Administered 2021-05-26 – 2021-05-29 (×6): 2 mg via ORAL

## 2021-05-26 NOTE — Progress Notes (Signed)
°   05/26/21 0530  Sleep  Number of Hours 7

## 2021-05-26 NOTE — [facility] Group Notes (Signed)
[facility] Group Notes:  (Nursing/MHT/Case Management/Adjunct)  Date:  05/26/2021  Time:  9:22 PM  Type of Therapy:    Wrap up  Participation Level:  Active  Participation Quality:  Appropriate  Affect:  Appropriate  Cognitive:  Appropriate  Insight:  Appropriate, Good, and Improving  Engagement in Group:  Developing/Improving  Modes of Intervention:  Discussion  Summary of Progress/Problems: PT came to group and was in a good mood. PT was active and shared that she wanted to work on not letting everyone get to her.   Lorita Officer 05/26/2021, 9:22 PM

## 2021-05-26 NOTE — Progress Notes (Signed)
°   05/26/21 2200  Psych Admission Type (Psych Patients Only)  Admission Status Involuntary  Psychosocial Assessment  Patient Complaints None  Eye Contact Fair  Facial Expression Anxious  Affect Depressed  Speech Logical/coherent  Interaction Assertive  Motor Activity Slow  Appearance/Hygiene Unremarkable  Behavior Characteristics Cooperative  Mood Pleasant  Aggressive Behavior  Effect No apparent injury  Thought Process  Coherency WDL  Content WDL  Delusions WDL  Perception WDL  Hallucination None reported or observed  Judgment WDL  Confusion None  Danger to Self  Current suicidal ideation? Denies  Danger to Others  Danger to Others None reported or observed
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
## 2021-05-26 NOTE — Progress Notes (Signed)
I attempted to call the pt's mother, Ola Raap, phone number [phone], at 7:51 pm on 05-26-2021. The phone did not answer and the message said that the service that is being used is restricted. There was not ability to leave a voicemail.

## 2021-05-26 NOTE — Group Note (Signed)
Recreation Therapy Group Note   Group Topic:Animal Assisted Therapy   Group Date: 05/26/2021 Start Time: 1420 End Time: 1507 Facilitators: Caroll Rancher, LRT,CTRS Location: [address]   AAA/T Program Assumption of Risk Form signed by Patient/ or Parent Legal Guardian YES  Patient is free of allergies or severe asthma  YES  Patient reports no fear of animals YES  Patient reports no history of cruelty to animals YES  Patient understands their participation is voluntary YES  Patient washes hands before animal contact YES  Patient washes hands after animal contact YES   Group Description: Patients provided opportunity to interact with trained and credentialed [facility] dog and the community volunteer/dog handler. Patients practiced appropriate animal interaction and were educated on dog safety outside of the hospital in common community settings. Patients were allowed to use dog toys and other items to practice commands, engage the dog in play, and/or complete routine aspects of animal care.   Education: Charity fundraiser, Health visitor, Communication & Social Skills    Affect/Mood: Appropriate   Participation Level: Engaged    Clinical Observations/Individualized Feedback: Pt attended and was appropriate.  Pt asked questions of our volunteer as well as shared stories of pets back home.     Plan: Continue to engage patient in RT group sessions 2-3x/week.   Caroll Rancher, Antonietta Jewel  05/26/2021 3:32 PM
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
## 2021-05-26 NOTE — [facility] Group Notes (Signed)
Pt attended and contributed to group 

## 2021-05-27 MED ORDER — VITAMIN D3 25 MCG PO TABS
1000.0000 [IU] | ORAL_TABLET | Freq: Every day | ORAL | Status: DC
  Administered 2021-05-27 – 2021-05-29 (×3): 1000 [IU] via ORAL
  Filled 2021-05-27 (×6): qty 1

## 2021-05-27 NOTE — Group Note (Signed)
Recreation Therapy Group Note ? ? ?Group Topic:Stress Management  ?Group Date: 05/27/2021 ?Start Time: 0930 ?End Time: 0950 ?Facilitators: Caroll Rancher, LRT,CTRS ?Location: [address] ? ? ?Goal Area(s) Addresses:  ?Patient will actively participate in stress management techniques presented during session.  ?Patient will successfully identify benefit of practicing stress management post d/c.  ? ?Group Description: Guided Imagery. LRT provided education, instruction, and demonstration on practice of visualization via guided imagery. Patient was asked to participate in the technique introduced during session. LRT debriefed including topics of mindfulness, stress management and specific scenarios each patient could use these techniques. Patients were given suggestions of ways to access scripts post d/c and encouraged to explore Youtube and other apps available on smartphones, tablets, and computers. ? ? ?Affect/Mood: Appropriate ?  ?Participation Level: Active ?  ?Participation Quality: Independent ?  ?Behavior: Attentive  ?  ?Speech/Thought Process: Focused ?  ?Insight: Good ?  ?Judgement: Good ?  ?Modes of Intervention: Script, Ashby Dawes Sounds ?  ?Patient Response to Interventions:  Attentive ?  ?Education Outcome: ? Acknowledges education and In group clarification offered   ? ?Clinical Observations/Individualized Feedback: Pt attended and participated in group session.  ?  ? ?Plan: Continue to engage patient in RT group sessions 2-3x/week. ? ? ?Caroll Rancher, LRT,CTRS ?05/27/2021 11:29 AM ?
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
## 2021-05-27 NOTE — Progress Notes (Signed)
The patient attended N.A. group and was appropriate.  ?

## 2021-05-27 NOTE — [facility] Group Notes (Signed)
Adult Psychoeducational Group Note ? ?Date:  05/27/2021 ?Time:  2:33 PM ? ?Group Topic/Focus:  ?Wellness Toolbox:   The focus of this group is to discuss various aspects of wellness, balancing those aspects and exploring ways to increase the ability to experience wellness.  Patients will create a wellness toolbox for use upon discharge. ? ?Participation Level:  Active ? ?Participation Quality:  Attentive ? ?Affect:  Appropriate ? ?Cognitive:  Alert ? ?Insight: Appropriate ? ?Engagement in Group:  Engaged ? ?Modes of Intervention:  Activity ? ?Additional Comments:  Patient attended and participated in the relaxation group activity. ? ?[name] ?05/27/2021, 2:33 PM ?

## 2021-05-27 NOTE — Progress Notes (Signed)
?   05/27/21 0545  ?Sleep  ?Number of Hours 6.75  ? ? ?

## 2021-05-27 NOTE — Group Note (Signed)
LCSW Group Therapy Note ? ?Group Date: 05/27/2021 ?Start Time: 1300 ?End Time: 1400 ? ? ?Type of Therapy and Topic:  Group Therapy: Using "I" Statements ? ?Participation Level:  Active ? ?Description of Group:  ?Patients were asked to provide details of some interpersonal conflicts they have experienced. Patients were then educated about ?I? statements, communication which focuses on feelings or views of the speaker rather than what the other person is doing. T group members were asked to reflect on past conflicts and to provide specific examples for utilizing ?I? statements. ? ?Therapeutic Goals: ? ?Patients will verbalize understanding of ineffective communication and effective communication. ?Patients will be able to empathize with whom they are having conflict. ?Patients will practice effective communication in the form of ?I? statements. ? ? ? ?Summary of Patient Progress: The patient was present/active throughout the session and proved open to feedback from CSW and peers. Patient demonstrated insight into the subject matter, was respectful of peers, and was present throughout the entire session. ? ?Therapeutic Modalities:   ?Cognitive Behavioral Therapy ?Solution-Focused Therapy ? ? ? ?Felizardo Hoffmann, LCSW ?05/27/2021  2:03 PM   ? ?

## 2021-05-27 NOTE — Progress Notes (Signed)
Pt upset on the unit after talking to mother on the phone, writer talked to pt and pt appeared better ? ? ? 05/27/21 2300  ?Psych Admission Type (Psych Patients Only)  ?Admission Status Involuntary  ?Psychosocial Assessment  ?Patient Complaints None  ?Eye Contact Darting  ?Facial Expression Anxious;Sad;Worried  ?Affect Preoccupied  ?Speech Logical/coherent  ?Interaction Assertive  ?Motor Activity Slow  ?Appearance/Hygiene Unremarkable  ?Behavior Characteristics Anxious  ?Mood Pleasant  ?Aggressive Behavior  ?Effect No apparent injury  ?Thought Process  ?Coherency WDL  ?Content WDL  ?Delusions None reported or observed  ?Perception WDL  ?Hallucination None reported or observed  ?Judgment WDL  ?Confusion None  ?Danger to Self  ?Current suicidal ideation? Denies  ?Danger to Others  ?Danger to Others None reported or observed  ? ? ?

## 2021-05-27 NOTE — Progress Notes (Signed)
Pt denies SI/HI/AVH and verbally agrees to approach staff if these become apparent or before harming themselves/others. Rates depression 0/10. Rates anxiety 0/10. Rates pain 0/10. Pt has improved in mood but still becomes anxious with those that she speaks with on the phone. Pt still thinks there is nothing wrong. Scheduled medications administered to pt, per MD orders. RN provided support and encouragement to pt. Q15 min safety checks implemented and continued. Pt safe on the unit. RN will continue to monitor and intervene as needed.  ? 05/27/21 0737  ?Psych Admission Type (Psych Patients Only)  ?Admission Status Involuntary  ?Psychosocial Assessment  ?Patient Complaints None  ?Eye Contact Fair  ?Facial Expression Animated;Anxious  ?Affect Appropriate to circumstance;Sad  ?Speech Logical/coherent  ?Interaction Assertive  ?Motor Activity Other (Comment) ?(WDL)  ?Appearance/Hygiene Unremarkable;Improved  ?Behavior Characteristics Cooperative;Appropriate to situation;Calm  ?Mood Pleasant  ?Thought Process  ?Coherency WDL  ?Content WDL  ?Delusions None reported or observed  ?Perception WDL  ?Hallucination None reported or observed  ?Judgment WDL  ?Confusion None  ?Danger to Self  ?Current suicidal ideation? Denies  ?Danger to Others  ?Danger to Others None reported or observed  ? ? ?

## 2021-05-28 LAB — VITAMIN B1: Vitamin B1 (Thiamine): 90.1 nmol/L (ref 66.5–200.0)

## 2021-05-28 MED ORDER — FLUOXETINE HCL 10 MG PO CAPS
10.0000 mg | ORAL_CAPSULE | Freq: Once | ORAL | Status: AC
  Administered 2021-05-28: 10 mg via ORAL

## 2021-05-28 MED ORDER — FLUOXETINE HCL 20 MG PO CAPS
20.0000 mg | ORAL_CAPSULE | Freq: Every day | ORAL | Status: DC
  Administered 2021-05-29: 20 mg via ORAL
  Filled 2021-05-28 (×3): qty 1

## 2021-05-28 NOTE — Group Note (Signed)
Date:  05/28/2021 ?Time:  9:48 AM ? ?Group Topic/Focus:  ?Orientation:   The focus of this group is to educate the patient on the purpose and policies of crisis stabilization and provide a format to answer questions about their admission.  The group details unit policies and expectations of patients while admitted. ? ? ? ?Participation Level:  Active ? ?Participation Quality:  Appropriate ? ?Affect:  Appropriate ? ?Cognitive:  Appropriate ? ?Insight: Appropriate ? ?Engagement in Group:  Engaged ? ?Modes of Intervention:  Discussion ? ?Additional Comments:   ? ?Lochlan Grygiel Lashawn Shreshta Medley ?05/28/2021, 9:48 AM ? ?
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
## 2021-05-28 NOTE — Progress Notes (Signed)
?   05/28/21 0630  ?Sleep  ?Number of Hours 6.75  ? ? ?

## 2021-05-28 NOTE — Progress Notes (Signed)
?   05/28/21 2200  ?Psych Admission Type (Psych Patients Only)  ?Admission Status Involuntary  ?Psychosocial Assessment  ?Patient Complaints None  ?Eye Contact Fair  ?Facial Expression Anxious;Animated  ?Affect Anxious;Sad  ?Speech Logical/coherent  ?Interaction Assertive  ?Motor Activity Slow  ?Appearance/Hygiene Unremarkable  ?Behavior Characteristics Cooperative  ?Mood Pleasant;Sad  ?Aggressive Behavior  ?Effect No apparent injury  ?Thought Process  ?Coherency WDL  ?Content WDL  ?Delusions WDL  ?Perception WDL  ?Hallucination None reported or observed  ?Judgment WDL  ?Confusion None  ?Danger to Self  ?Current suicidal ideation? Denies  ?Danger to Others  ?Danger to Others None reported or observed  ? ? ?

## 2021-05-28 NOTE — Progress Notes (Signed)
[facility] Notes:  (Nursing/MHT/Case Management/Adjunct) ? ?Date:  05/28/2021  ?Time:  2015 ?Type of Therapy:   wrap up group ? ?Participation Level:  Active ? ?Participation Quality:  Attentive, Intrusive, Redirectable, Sharing, and Supportive ? ?Affect:  Labile ? ?Cognitive:  Alert ? ?Insight:  Improving ? ?Engagement in Group:  Engaged ? ?Modes of Intervention:  Clarification, Education, and Socialization ? ?Summary of Progress/Problems:Positive thinking and self-care were discussed.  ? ?Winfield Rast S ?05/28/2021, 9:18 PM ?

## 2021-05-28 NOTE — Progress Notes (Signed)
Pt denies SI/HI/AVH and verbally agrees to approach staff if these become apparent or before harming themselves/others. Rates depression 0/10. Rates anxiety 0/10. Rates pain 8/10.  Anxious to leave but ready to go. Scheduled medications administered to pt, per MD orders. RN provided support and encouragement to pt. Q15 min safety checks implemented and continued. Pt safe on the unit. RN will continue to monitor and intervene as needed.  ? 05/28/21 0806  ?Psych Admission Type (Psych Patients Only)  ?Admission Status Involuntary  ?Psychosocial Assessment  ?Patient Complaints None  ?Eye Contact Fair  ?Facial Expression Anxious;Animated  ?Affect Appropriate to circumstance;Preoccupied  ?Speech Logical/coherent  ?Interaction Assertive  ?Motor Activity Slow  ?Appearance/Hygiene Unremarkable  ?Behavior Characteristics Cooperative;Anxious  ?Mood Pleasant  ?Thought Process  ?Coherency WDL  ?Content WDL  ?Delusions None reported or observed  ?Perception WDL  ?Hallucination None reported or observed  ?Judgment Impaired  ?Confusion None  ?Danger to Self  ?Current suicidal ideation? Denies  ?Danger to Others  ?Danger to Others None reported or observed  ? ? ?

## 2021-05-29 ENCOUNTER — Encounter (HOSPITAL_COMMUNITY): Payer: Self-pay

## 2021-05-29 DIAGNOSIS — F332 Major depressive disorder, recurrent severe without psychotic features: Principal | ICD-10-CM

## 2021-05-29 MED ORDER — VITAMIN D3 25 MCG PO TABS: 1000.0000 [IU] | ORAL_TABLET | Freq: Every day | ORAL | 0 refills | Status: AC

## 2021-05-29 MED ORDER — FLUOXETINE HCL 20 MG PO CAPS: 20.0000 mg | ORAL_CAPSULE | Freq: Every day | ORAL | 0 refills | Status: AC

## 2021-05-29 MED ORDER — NICOTINE POLACRILEX 2 MG MT GUM: 2.0000 mg | CHEWING_GUM | OROMUCOSAL | 0 refills | Status: AC | PRN

## 2021-05-29 NOTE — BH IP Treatment Plan (Signed)
Interdisciplinary Treatment and Diagnostic Plan Update ? ?05/29/2021 ?Dawn Levy ?MRN: [id number] ? ?Principal Diagnosis: MDD (major depressive disorder), recurrent severe, without psychosis (HCC) ? ?Secondary Diagnoses: Principal Problem: ?  MDD (major depressive disorder), recurrent severe, without psychosis (HCC) ?Active Problems: ?  MDD (major depressive disorder) ?  Adjustment disorder with mixed anxiety and depressed mood ?  UTI (urinary tract infection) ? ? ?Current Medications:  ?Current Facility-Administered Medications  ?Medication Dose Route Frequency Provider Last Rate Last Admin  ? acetaminophen (TYLENOL) tablet 650 mg  650 mg Oral Q6H PRN Oneta Rack, NP   650 mg at 05/28/21 0806  ? alum & mag hydroxide-simeth (MAALOX/MYLANTA) 200-200-20 MG/5ML suspension 30 mL  30 mL Oral Q4H PRN Oneta Rack, NP      ? feeding supplement (ENSURE ENLIVE / ENSURE PLUS) liquid 237 mL  237 mL Oral BID BM Mason Jim, Amy E, MD   237 mL at 05/29/21 0940  ? FLUoxetine (PROZAC) capsule 20 mg  20 mg Oral Daily Doda, Vandana, MD   20 mg at 05/29/21 7564  ? hydrOXYzine (ATARAX) tablet 25 mg  25 mg Oral TID PRN Oneta Rack, NP      ? magnesium hydroxide (MILK OF MAGNESIA) suspension 30 mL  30 mL Oral Daily PRN Oneta Rack, NP      ? nicotine polacrilex (NICORETTE) gum 2 mg  2 mg Oral PRN Comer Locket, MD   2 mg at 05/28/21 1816  ? nitrofurantoin (macrocrystal-monohydrate) (MACROBID) capsule 100 mg  100 mg Oral Q12H Hill, Shelbie Hutching, MD   100 mg at 05/29/21 0804  ? prenatal multivitamin tablet 1 tablet  1 tablet Oral Q1200 Roselle Locus, MD   1 tablet at 05/28/21 1207  ? traZODone (DESYREL) tablet 50 mg  50 mg Oral QHS PRN Oneta Rack, NP      ? Vitamin D3 (Vitamin D) tablet 1,000 Units  1,000 Units Oral Daily Karsten Ro, MD   1,000 Units at 05/29/21 0804  ? ?PTA Medications: ?Medications Prior to Admission  ?Medication Sig Dispense Refill Last Dose  ? ferrous sulfate 325 (65 FE) MG tablet  Take 325 mg by mouth daily with breakfast.     ? NIFEdipine (ADALAT CC) 30 MG 24 hr tablet Take 30 mg by mouth daily. Has not taken in the past 2 months because of barriers, but does need to resume on this medication     ? Prenatal Vit-Fe Fumarate-FA (MULTIVITAMIN-PRENATAL) 27-0.8 MG TABS tablet Take 1 tablet by mouth daily.     ? ? ?Patient Stressors: Financial difficulties   ?Loss of relationship   ?Traumatic event   ? ?Patient Strengths: Ability for insight  ?Average or above average intelligence  ?Communication skills  ?Motivation for treatment/growth  ?Physical Health  ?Supportive family/friends  ? ?Treatment Modalities: Medication Management, Group therapy, Case management,  ?1 to 1 session with clinician, Psychoeducation, Recreational therapy. ? ? ?Physician Treatment Plan for Primary Diagnosis: MDD (major depressive disorder), recurrent severe, without psychosis (HCC) ?Long Term Goal(s): Improvement in symptoms so as ready for discharge  ? ?Short Term Goals: Ability to identify changes in lifestyle to reduce recurrence of condition will improve ?Ability to verbalize feelings will improve ?Ability to disclose and discuss suicidal ideas ?Ability to demonstrate self-control will improve ?Ability to identify and develop effective coping behaviors will improve ?Ability to maintain clinical measurements within normal limits will improve ?Ability to identify triggers associated with substance abuse/mental health issues will improve ? ?Medication Management:  Evaluate patient's response, side effects, and tolerance of medication regimen. ? ?Therapeutic Interventions: 1 to 1 sessions, Unit Group sessions and Medication administration. ? ?Evaluation of Outcomes: Adequate for Discharge ? ?Physician Treatment Plan for Secondary Diagnosis: Principal Problem: ?  MDD (major depressive disorder), recurrent severe, without psychosis (HCC) ?Active Problems: ?  MDD (major depressive disorder) ?  Adjustment disorder with mixed  anxiety and depressed mood ?  UTI (urinary tract infection) ? ?Long Term Goal(s): Improvement in symptoms so as ready for discharge  ? ?Short Term Goals: Ability to identify changes in lifestyle to reduce recurrence of condition will improve ?Ability to verbalize feelings will improve ?Ability to disclose and discuss suicidal ideas ?Ability to demonstrate self-control will improve ?Ability to identify and develop effective coping behaviors will improve ?Ability to maintain clinical measurements within normal limits will improve ?Ability to identify triggers associated with substance abuse/mental health issues will improve    ? ?Medication Management: Evaluate patient's response, side effects, and tolerance of medication regimen. ? ?Therapeutic Interventions: 1 to 1 sessions, Unit Group sessions and Medication administration. ? ?Evaluation of Outcomes: Adequate for Discharge ? ? ?RN Treatment Plan for Primary Diagnosis: MDD (major depressive disorder), recurrent severe, without psychosis (HCC) ?Long Term Goal(s): Knowledge of disease and therapeutic regimen to maintain health will improve ? ?Short Term Goals: Ability to participate in decision making will improve, Ability to verbalize feelings will improve, and Ability to identify and develop effective coping behaviors will improve ? ?Medication Management: RN will administer medications as ordered by provider, will assess and evaluate patient's response and provide education to patient for prescribed medication. RN will report any adverse and/or side effects to prescribing provider. ? ?Therapeutic Interventions: 1 on 1 counseling sessions, Psychoeducation, Medication administration, Evaluate responses to treatment, Monitor vital signs and CBGs as ordered, Perform/monitor CIWA, COWS, AIMS and Fall Risk screenings as ordered, Perform wound care treatments as ordered. ? ?Evaluation of Outcomes: Adequate for Discharge ? ? ?LCSW Treatment Plan for Primary Diagnosis: MDD  (major depressive disorder), recurrent severe, without psychosis (HCC) ?Long Term Goal(s): Safe transition to appropriate next level of care at discharge, Engage patient in therapeutic group addressing interpersonal concerns. ? ?Short Term Goals: Engage patient in aftercare planning with referrals and resources, Increase ability to appropriately verbalize feelings, and Increase emotional regulation ? ?Therapeutic Interventions: Assess for all discharge needs, 1 to 1 time with Child psychotherapist, Explore available resources and support systems, Assess for adequacy in community support network, Educate family and significant other(s) on suicide prevention, Complete Psychosocial Assessment, Interpersonal group therapy. ? ?Evaluation of Outcomes: Adequate for Discharge ? ? ?Progress in Treatment: ?Attending groups: Yes. ?Participating in groups: Yes. ?Taking medication as prescribed: Yes. ?Toleration medication: Yes. ?Family/Significant other contact made: Yes, individual(s) contacted:  attempted multiple times to contact mother ?Patient understands diagnosis: Yes. ?Discussing patient identified problems/goals with staff: Yes. ?Medical problems stabilized or resolved: Yes. ?Denies suicidal/homicidal ideation: Yes. ?Issues/concerns per patient self-inventory: No. ?Other: None ? ?New problem(s) identified: No, Describe:  None ? ?New Short Term/Long Term Goal(s):medication stabilization, elimination of SI thoughts, development of comprehensive mental wellness plan. ? ?Patient Goals:  "To stay away from people who are toxic and causing my depression. To set boundaries" ? ?Discharge Plan or Barriers: Pt will f/u for medication management and therapy services at [facility]. ? ?Reason for Continuation of Hospitalization: Medication stabilization ? ?Estimated Length of Stay: 3-5 days ? ? ?Scribe for Treatment Team: ?Felizardo Hoffmann, Theresia Majors ?05/29/2021 ?10:45 AM ?

## 2021-05-29 NOTE — Group Note (Signed)
Recreation Therapy Group Note ? ? ?Group Topic:Stress Management  ?Group Date: 05/29/2021 ?Start Time: 30 ?End Time: 3614 ?Facilitators: Caroll Rancher, LRT,CTRS ?Location: [address] Dayroom ? ? ?Goal Area(s) Addresses:  ?Patient will identify positive stress management techniques. ?Patient will identify benefits of using stress management post d/c. ? ?Group Description:  Meditation.  LRT played a mountain meditation which patients were encouraged to visualize a mountain and imagine taking on its qualities.  The meditation spoke of the mountain being able to stand through anything its faced with and still keep the qualities it has.  Patients were to listen and follow along as meditation played to fully engage in activity. ? ? ?Affect/Mood: Appropriate ?  ?Participation Level: Active ?  ?Participation Quality: Independent ?  ?Behavior: Attentive  ?  ?Speech/Thought Process: Focused ?  ?Insight: Good ?  ?Judgement: Good ?  ?Modes of Intervention: Meditation ?  ?Patient Response to Interventions:  Attentive ?  ?Education Outcome: ? Acknowledges education and In group clarification offered   ? ?Clinical Observations/Individualized Feedback: Pt attended and participated in group session.  ? ? ?Plan: Continue to engage patient in RT group sessions 2-3x/week. ? ? ?Caroll Rancher, LRT,CTRS ?05/29/2021 12:06 PM ?
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
## 2021-05-29 NOTE — [facility] Group Notes (Signed)
Goals group: patient attended and contributed to group. ?

## 2021-05-29 NOTE — Group Note (Signed)
[facility] LCSW Group Therapy Note ? ?Date/Time: 05/29/2021 @ 1pm ? ?Type of Therapy and Topic:  Group Therapy:  Who Am I?  Self Esteem, Self-Actualization and Understanding Self. ? ?Participation Level:  Active ? ?Description of Group:   ? In this group patients will be asked to explore values, beliefs, truths, and morals as they relate to personal self.  Patients will be guided to discuss their thoughts, feelings, and behaviors related to what they identify as important to their true self. Patients will process together how values, beliefs and truths are connected to specific choices patients make every day. Each patient will be challenged to identify changes that they are motivated to make in order to improve self-esteem and self-actualization. This group will be process-oriented, with patients participating in exploration of their own experiences as well as giving and receiving support and challenge from other group members. ? ?Therapeutic Goals: ?Patient will identify false beliefs that currently interfere with their self-esteem.  ?Patient will identify feelings, thought process, and behaviors related to self and will become aware of the uniqueness of themselves and of others.  ?Patient will be able to identify and verbalize values, morals, and beliefs as they relate to self. ?Patient will begin to learn how to build self-esteem/self-awareness by expressing what is important and unique to them personally. ? ?Summary of Patient Progress: Patient participated appropriately in group. Patient identified her children as people important to her.  Patient was able to participate in discussion questions regarding value exploration.  Patient provided and gave feedback to other group members about group topic. ? ? ? ? ? ? ? ? ? ? ? ? ?Therapeutic Modalities:   ?Cognitive Behavioral Therapy ?Solution Focused Therapy ?Motivational Interviewing ?Brief Therapy ? ? ?Lorrena Goranson, LCSW, LCAS ?Clincal Social Worker  ?[facility] ? ? ?

## 2021-05-29 NOTE — Progress Notes (Signed)
?  [facility] Adult Case Management Discharge Plan : ? ?Will you be returning to the same living situation after discharge:  Yes,  mother's home ?At discharge, do you have transportation home?: Yes,  provided bus pass ?Do you have the ability to pay for your medications: Yes,  Medicaid ? ?Release of information consent forms completed and sent to medical records;  Patient's signature obtained.  ? ?Patient to Follow up at: ? Follow-up Information   ? ? [facility]. Go to.   ?Specialty: Behavioral Health ?Why: Please go to this provider for therapy and/or medication management services during walk in hours:  Tuesdays and Wednesdays from 7:30 am to 10:30 am. ?Contact information: ?[address] ?[place] [address] ?[phone] ? ?  ?  ? ?  ?  ? ?  ? ? ?Next level of care provider has access to [facility] Link:yes ? ?Safety Planning and Suicide Prevention discussed: Yes,  w/ pt ? ?  ? ?Has patient been referred to the Quitline?: Patient refused referral ? ?Patient has been referred for addiction treatment: N/A ? ?Felizardo Hoffmann, LCSWA ?05/29/2021, 10:20 AM ?

## 2021-05-29 NOTE — Progress Notes (Signed)
?   05/29/21 0530  ?Sleep  ?Number of Hours 6.25  ? ? ?

## 2021-05-29 NOTE — Progress Notes (Signed)
Discharge note: RN met with pt and reviewed pt's discharge instructions. Pt verbalized understanding of discharge instructions and pt did not have any questions. RN reviewed and provided pt with a copy of SRA, AVS and Transition Record. RN returned pt's belongings to pt. Pt denied SI/HI/AVH and voiced no concerns. Pt was appreciative of the care pt received at [facility]. Patient discharged to the lobby without incident.  ? 05/29/21 0804  ?Psych Admission Type (Psych Patients Only)  ?Admission Status Voluntary  ?Psychosocial Assessment  ?Patient Complaints Anxiety  ?Eye Contact Fair  ?Facial Expression Anxious;Animated  ?Affect Anxious;Sad  ?Speech Logical/coherent  ?Interaction Assertive  ?Motor Activity Other (Comment) ?(WDL)  ?Appearance/Hygiene Unremarkable  ?Behavior Characteristics Cooperative;Appropriate to situation;Calm  ?Mood Pleasant  ?Thought Process  ?Coherency WDL  ?Content WDL  ?Delusions None reported or observed  ?Perception WDL  ?Hallucination None reported or observed  ?Judgment WDL  ?Confusion None  ?Danger to Self  ?Current suicidal ideation? Denies  ?Danger to Others  ?Danger to Others None reported or observed  ? ? ?
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
## 2021-06-03 NOTE — [facility] Group Notes (Signed)
Spiritual care group on grief and loss facilitated by chaplain Janne Napoleon, [facility]   Group Goal:   Support / Education around grief and loss   Members engage in facilitated group support and psycho-social education.   Group Description:   Following introductions and group rules, group members engaged in facilitated group dialog and support around topic of loss, with particular support around experiences of loss in their lives. Group Identified types of loss (relationships / self / things) and identified patterns, circumstances, and changes that precipitate losses. Reflected on thoughts / feelings around loss, normalized grief responses, and recognized variety in grief experience. Group noted Worden's four tasks of grief in discussion.   Group drew on Adlerian / Rogerian, narrative, MI,   Patient Progress: Dawn Levy attended group and was an active participant in group conversation.  [address], [place] Pager, [phone]

## 2021-07-29 ENCOUNTER — Other Ambulatory Visit: Payer: Self-pay | Admitting: Physician Assistant

## 2021-07-29 ENCOUNTER — Other Ambulatory Visit (HOSPITAL_COMMUNITY): Payer: Self-pay

## 2021-07-29 ENCOUNTER — Encounter: Payer: Self-pay | Admitting: Physician Assistant

## 2021-07-29 MED ORDER — NIFEDIPINE ER OSMOTIC RELEASE 30 MG PO TB24: 30.0000 mg | ORAL_TABLET | Freq: Every day | ORAL | 1 refills | Status: DC

## 2021-07-29 MED ORDER — NIFEDIPINE ER OSMOTIC RELEASE 30 MG PO TB24
30.0000 mg | ORAL_TABLET | Freq: Every day | ORAL | 1 refills | Status: AC
  Filled 2021-07-29: qty 30, 30d supply, fill #0

## 2021-07-29 MED ORDER — AMLODIPINE BESYLATE 2.5 MG PO TABS: 2.5000 mg | ORAL_TABLET | Freq: Every day | ORAL | 1 refills | Status: DC

## 2021-07-29 MED ORDER — ALBUTEROL SULFATE HFA 108 (90 BASE) MCG/ACT IN AERS: 2.0000 | INHALATION_SPRAY | Freq: Four times a day (QID) | RESPIRATORY_TRACT | 0 refills | Status: DC | PRN

## 2021-07-29 MED ORDER — ALBUTEROL SULFATE HFA 108 (90 BASE) MCG/ACT IN AERS
2.0000 | INHALATION_SPRAY | Freq: Four times a day (QID) | RESPIRATORY_TRACT | 0 refills | Status: AC | PRN
  Filled 2021-07-29: qty 18, 25d supply, fill #0

## 2021-07-29 NOTE — Progress Notes (Signed)
Pt seen  by RBG ? ?She has been homeless for 2 years. She stayed briefly w/ family, has been here since Jan 2023.  ? 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 [facility] in [place], [place] ? ?She has a 25 year old dtr, has one other dtr w/ the same man.  ?She has 2 boys with another man. Her sons are with her mom.  ? ?She was dx w/ severe depression years ago.  ? ?She is not on any medications except an inhaler. It is old, she needs a new one >> sent in.  ? ?She never picked up the Prozac that was prescribed in Feb. She feels ok without it. She was given info on going to [facility]. She has a phone number that she calls for emotional support.  ? ?She is concerned about her anemia, was reassured that it is very mild at this time. Was given MVI w/ iron. ? ?Primary number is her cell, Home phone is her mom's number.  ? ?Vit D was low 02/28 at 13.33, this can be rechecked after she has been on the vitamins.  ?Lab Results  ?Component Value Date  ? WBC 7.8 05/25/2021  ? HGB 11.7 (L) 05/25/2021  ? HCT 38.6 05/25/2021  ? MCV 81.4 05/25/2021  ? PLT 344 05/25/2021  ? ?She c/o severe HR, hammering type, last one was a week ago. Tylenol does not help.  ? ?155/103, 80, 99% ? ?She has had HTN, dx after her son's birth 6 yr ago. She was told it was just because of the pregnancy.  ? ?She had pre-eclampsia with her 2 daughters. Both dtr's were born in [place]. Last dtr 02/2021. ? ?She was on Procardia in the past, was given 30 mg tabs again.  ? ?She wanted to know if she could be on birth control.  ? ?She wonders if she has a UTI. Has had frequent ones since childhood.  ? ?She smokes, says she cannot quit.  ? ?The importance of quitting was emphasized to her.  ? ?She wonders how long the morning after pill works and how often she can take it.  ? ? ?Theodore Demark, PA-C ?07/29/2021 ?5:25 PM ? ? ?  ?

## 2021-07-30 ENCOUNTER — Other Ambulatory Visit (HOSPITAL_COMMUNITY): Payer: Self-pay

## 2021-08-12 ENCOUNTER — Encounter: Payer: Self-pay | Admitting: Critical Care Medicine

## 2021-08-13 ENCOUNTER — Encounter: Payer: Self-pay | Admitting: *Deleted
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
## 2021-08-13 NOTE — Congregational Nurse Program (Signed)
Pt is given a full brown lunch bag of condoms and lubricant on request of pt. She thanks nurse and does not care to discuss anything else

## 2021-10-07 ENCOUNTER — Other Ambulatory Visit: Payer: Self-pay

## 2021-10-07 ENCOUNTER — Encounter (HOSPITAL_COMMUNITY): Payer: Self-pay

## 2021-10-07 ENCOUNTER — Emergency Department (HOSPITAL_COMMUNITY)
Admission: EM | Admit: 2021-10-07 | Discharge: 2021-10-08 | Disposition: A | Discharge status: Home / Self Care | Payer: Medicaid Other | Attending: Student | Admitting: Student

## 2021-10-07 DIAGNOSIS — S61250A Open bite of right index finger without damage to nail, initial encounter: Secondary | ICD-10-CM | POA: Insufficient documentation

## 2021-10-07 DIAGNOSIS — Z91040 Latex allergy status: Secondary | ICD-10-CM | POA: Insufficient documentation

## 2021-10-07 DIAGNOSIS — W5501XA Bitten by cat, initial encounter: Secondary | ICD-10-CM | POA: Diagnosis not present

## 2021-10-07 MED ORDER — RABIES IMMUNE GLOBULIN 150 UNIT/ML IM INJ
20.0000 [IU]/kg | INJECTION | Freq: Once | INTRAMUSCULAR | Status: DC
  Filled 2021-10-07: qty 20

## 2021-10-07 MED ORDER — RABIES VACCINE, PCEC IM SUSR
1.0000 mL | Freq: Once | INTRAMUSCULAR | Status: DC
Start: 2021-10-07 — End: 2021-10-08
  Filled 2021-10-07: qty 1

## 2021-10-07 MED ORDER — AMOXICILLIN-POT CLAVULANATE 875-125 MG PO TABS
1.0000 | ORAL_TABLET | Freq: Once | ORAL | Status: AC
  Administered 2021-10-07: 1 via ORAL
  Filled 2021-10-07: qty 1

## 2021-10-07 NOTE — ED Triage Notes (Signed)
Pt presents to ED from home with c/o cat bite to right pointer finger. Pt states she was bit by a stray cat, unsure if cat has had vaccinations.

## 2021-10-07 NOTE — ED Notes (Signed)
Patient is refusing rabies vaccine. She said "the time I have been here, I have done my research, and no one who has gotten bitten by a cat has gotten rabies in over 40 years. She said she fears needles, this is all making her anxiety worse." Patient educated on importance of receiving the vaccine, as she could get sick from the cat bite. Misty Stanley, [place] aware.

## 2021-10-07 NOTE — ED Provider Notes (Signed)
[facility]-EMERGENCY DEPT Provider Note   CSN: [id number] Arrival date & time: 10/07/21  2153     History  Chief Complaint  Patient presents with   Animal Bite    Dawn Levy is a 26 y.o. female.  The history is provided by the patient and medical records.  Animal Bite  26 y.o. F presenting to the ED following cat bite to right index finger.  States the cat hangs out around her moms porch.  She picked it up today while the cat was eating, bit her index finger.  Cat has never been to the vet, no vaccinations on file.  Animal control was not called and cat is not under quarantine currently.  Her tetanus is UTD.  Home Medications Prior to Admission medications   Medication Sig Start Date End Date Taking? Authorizing Provider  albuterol (VENTOLIN HFA) 108 (90 Base) MCG/ACT inhaler Inhale 2 puffs into the lungs every 6 (six) hours as needed for wheezing or shortness of breath. 07/29/21   Barrett, Joline Salt, PA-C  ferrous sulfate 325 (65 FE) MG tablet Take 325 mg by mouth daily with breakfast.    [provider]  FLUoxetine (PROZAC) 20 MG capsule Take 1 capsule (20 mg total) by mouth daily. 05/30/21   Massengill, Harrold Donath, MD  nicotine polacrilex (NICORETTE) 2 MG gum Take 1 each (2 mg total) by mouth as needed for smoking cessation. 05/29/21   Massengill, Harrold Donath, MD  NIFEdipine (PROCARDIA XL) 30 MG 24 hr tablet Take 1 tablet (30 mg total) by mouth daily. 07/29/21   Barrett, Joline Salt, PA-C  Prenatal Vit-Fe Fumarate-FA (MULTIVITAMIN-PRENATAL) 27-0.8 MG TABS tablet Take 1 tablet by mouth daily.    [provider]  Vitamin D3 (VITAMIN D) 25 MCG tablet Take 1 tablet (1,000 Units total) by mouth daily. 05/30/21   Phineas Inches, MD      Allergies    Latex    Review of Systems   Review of Systems  Skin:  Positive for wound.  All other systems reviewed and are negative.   Physical Exam Updated Vital Signs BP (!) 141/103   Pulse 77   Temp 98 F (36.7  C) (Oral)   Resp 18   Ht 5\' 4"  (1.626 m)   Wt 85.5 kg   SpO2 99%   BMI 32.35 kg/m   Physical Exam Vitals and nursing note reviewed.  Constitutional:      Appearance: She is well-developed.  HENT:     Head: Normocephalic and atraumatic.  Eyes:     Conjunctiva/sclera: Conjunctivae normal.     Pupils: Pupils are equal, round, and reactive to light.  Cardiovascular:     Rate and Rhythm: Normal rate and regular rhythm.     Heart sounds: Normal heart sounds.  Pulmonary:     Effort: Pulmonary effort is normal.     Breath sounds: Normal breath sounds.  Abdominal:     General: Bowel sounds are normal.     Palpations: Abdomen is soft.  Musculoskeletal:        General: Normal range of motion.     Cervical back: Normal range of motion.     Comments: Superficial appearing abrasion to right index finger, no active bleeding, no swelling/bruising, able to flex/extend without issue  Skin:    General: Skin is warm and dry.  Neurological:     Mental Status: She is alert and oriented to person, place, and time.     ED Results / Procedures / Treatments  Labs (all labs ordered are listed, but only abnormal results are displayed) Labs Reviewed  URINALYSIS, ROUTINE W REFLEX MICROSCOPIC - Abnormal; Notable for the following components:      Result Value   APPearance CLOUDY (*)    Ketones, ur 5 (*)    Protein, ur 30 (*)    Nitrite POSITIVE (*)    Leukocytes,Ua LARGE (*)    WBC, UA >50 (*)    Bacteria, UA RARE (*)    All other components within normal limits  PREGNANCY, URINE    EKG None  Radiology No results found.  Procedures Procedures    Medications Ordered in ED Medications  rabies immune globulin (HYPERRAB/KEDRAB) injection 1,725 Units (1,725 Units Intramuscular Patient Refused/Not Given 10/07/21 2340)  rabies vaccine (RABAVERT) injection 1 mL (1 mL Intramuscular Patient Refused/Not Given 10/07/21 2340)  amoxicillin-clavulanate (AUGMENTIN) 875-125 MG per tablet 1 tablet  (1 tablet Oral Given 10/07/21 2347)    ED Course/ Medical Decision Making/ A&P                           Medical Decision Making Amount and/or Complexity of Data Reviewed Labs: ordered.  Risk Prescription drug management.   26 y.o. F presenting to the ED with cat bite to right index finger from stray cat.  States cat usually stays around her moms porch, no prior vet visits or vaccinations.  She did not call animal control as she was afraid they would put the cat down.  She has single abrasion to right index finger.  Superficial without swelling/bruising or bony deformity.  Her tetanus is UTD.    Discussed with patient options of rabies vaccines as cat is unvaccinated.  States she is afraid of needles and would prefer to avoid this, however also does not want to surrender the cat to.  Ultimately has decided to go forward with rabies vaccine.  We will also start Augmentin, first dose given in the ED.  11:41 PM Patient now refusing rabies vaccines/globulin.  She states she has been "googling" on her phone and does not trust the vaccines.  She is aware of risks of foregoing vaccines including possible rabies infection.  She still declines.  She is requesting UA to check to UTI.  This will be sent.  UA with + nitrites but rare bacteria, large leuks, and 21-50 squamous cells.  May ultimately be contamination but will already be on augmentin for cat bite so should provide adequate coverage.  As patient did refuse rabies vaccines here, encouraged to call animal control to have them quarantine the cat for monitoring.  Can return here for new concerns.  Final Clinical Impression(s) / ED Diagnoses Final diagnoses:  Cat bite, initial encounter    Rx / DC Orders ED Discharge Orders          Ordered    amoxicillin-clavulanate (AUGMENTIN) 875-125 MG tablet  Every 12 hours        10/08/21 0025              Garlon Hatchet, PA-C 10/08/21 0231    Glendora Score, MD 10/09/21 1145

## 2021-10-08 LAB — PREGNANCY, URINE: Preg Test, Ur: NEGATIVE

## 2021-10-08 LAB — URINALYSIS, ROUTINE W REFLEX MICROSCOPIC
Bilirubin Urine: NEGATIVE
Glucose, UA: NEGATIVE mg/dL
Hgb urine dipstick: NEGATIVE
Ketones, ur: 5 mg/dL — AB
Nitrite: POSITIVE — AB
Protein, ur: 30 mg/dL — AB
Specific Gravity, Urine: 1.024 (ref 1.005–1.030)
WBC, UA: >50 WBC/hpf — ABNORMAL HIGH (ref 0–5)
pH: 7 (ref 5.0–8.0)

## 2021-10-08 MED ORDER — AMOXICILLIN-POT CLAVULANATE 875-125 MG PO TABS: 1.0000 | ORAL_TABLET | Freq: Two times a day (BID) | ORAL | 0 refills | Status: DC

## 2021-10-08 NOTE — Discharge Instructions (Signed)
Take the prescribed medication as directed. You have decided against receiving rabies vaccines today.  Be aware of the risk of this, especially since cat is not vaccinated including but not limited to worsening infection, possible rabies infection, or other complications.  I encourage you to call animal control and have him quarantine the cat for monitoring. Follow-up with your primary care doctor. Return to the ED for new or worsening symptoms.

## 2021-11-09 ENCOUNTER — Ambulatory Visit (HOSPITAL_COMMUNITY)
Admission: EM | Admit: 2021-11-09 | Discharge: 2021-11-09 | Disposition: A | Discharge status: Home / Self Care | Payer: Medicaid Other | Attending: Internal Medicine | Admitting: Internal Medicine

## 2021-11-09 ENCOUNTER — Encounter (HOSPITAL_COMMUNITY): Payer: Self-pay | Admitting: Emergency Medicine

## 2021-11-09 ENCOUNTER — Other Ambulatory Visit: Payer: Self-pay

## 2021-11-09 ENCOUNTER — Encounter: Payer: Self-pay | Admitting: *Deleted

## 2021-11-09 DIAGNOSIS — B9789 Other viral agents as the cause of diseases classified elsewhere: Secondary | ICD-10-CM | POA: Insufficient documentation

## 2021-11-09 DIAGNOSIS — Z20822 Contact with and (suspected) exposure to covid-19: Secondary | ICD-10-CM | POA: Diagnosis not present

## 2021-11-09 DIAGNOSIS — Z3202 Encounter for pregnancy test, result negative: Secondary | ICD-10-CM | POA: Insufficient documentation

## 2021-11-09 DIAGNOSIS — N3001 Acute cystitis with hematuria: Secondary | ICD-10-CM | POA: Diagnosis not present

## 2021-11-09 DIAGNOSIS — B964 Proteus (mirabilis) (morganii) as the cause of diseases classified elsewhere: Secondary | ICD-10-CM | POA: Insufficient documentation

## 2021-11-09 DIAGNOSIS — R300 Dysuria: Secondary | ICD-10-CM | POA: Insufficient documentation

## 2021-11-09 DIAGNOSIS — J028 Acute pharyngitis due to other specified organisms: Secondary | ICD-10-CM | POA: Diagnosis not present

## 2021-11-09 DIAGNOSIS — J029 Acute pharyngitis, unspecified: Secondary | ICD-10-CM | POA: Diagnosis not present

## 2021-11-09 LAB — POCT URINALYSIS DIPSTICK, ED / UC
Bilirubin Urine: NEGATIVE
Glucose, UA: NEGATIVE mg/dL
Nitrite: POSITIVE — AB
Protein, ur: >=300 mg/dL — AB
Specific Gravity, Urine: 1.02 (ref 1.005–1.030)
Urobilinogen, UA: 1 mg/dL (ref 0.0–1.0)
pH: 8.5 — ABNORMAL HIGH (ref 5.0–8.0)

## 2021-11-09 LAB — HIV ANTIBODY (ROUTINE TESTING W REFLEX): HIV Screen 4th Generation wRfx: NONREACTIVE

## 2021-11-09 LAB — POC URINE PREG, ED: Preg Test, Ur: NEGATIVE

## 2021-11-09 MED ORDER — CEPHALEXIN 500 MG PO CAPS
500.0000 mg | ORAL_CAPSULE | Freq: Two times a day (BID) | ORAL | 0 refills | Status: AC
  Filled 2021-11-09: qty 10, 5d supply, fill #0
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
## 2021-11-09 NOTE — Discharge Instructions (Addendum)
Warm salt water gargle Please take medications as prescribed Tylenol/Motrin as needed for pain and/or fever Maintain adequate hydration Call you with recommendations if labs are abnormal Safe sexual practices recommended Return to urgent care if you have any other concerns or questions.

## 2021-11-09 NOTE — ED Triage Notes (Signed)
Sore throat, sneezing, cough, runny nose since Saturday. Needs covid test for living situation. Also requesting STD (swab and bloodwork) as well as UTI and pregnancy testing

## 2021-11-10 LAB — CERVICOVAGINAL ANCILLARY ONLY
Chlamydia: NEGATIVE
Comment: NEGATIVE
Comment: NEGATIVE
Comment: NORMAL
Neisseria Gonorrhea: NEGATIVE
Trichomonas: NEGATIVE

## 2021-11-10 LAB — SARS CORONAVIRUS 2 (TAT 6-24 HRS): SARS Coronavirus 2: NEGATIVE

## 2021-11-10 LAB — RPR: RPR Ser Ql: NONREACTIVE
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
## 2021-11-11 LAB — URINE CULTURE: Culture: >=100000 — AB

## 2021-11-13 ENCOUNTER — Other Ambulatory Visit: Payer: Self-pay

## 2021-11-18 ENCOUNTER — Other Ambulatory Visit: Payer: Self-pay | Admitting: Physician Assistant

## 2021-11-18 ENCOUNTER — Encounter: Payer: Self-pay | Admitting: Physician Assistant

## 2021-11-18 MED ORDER — CEPHALEXIN 500 MG PO CAPS
500.0000 mg | ORAL_CAPSULE | Freq: Four times a day (QID) | ORAL | 0 refills | Status: DC
Start: 2021-11-18 — End: 2021-12-03

## 2021-11-18 NOTE — Progress Notes (Signed)
Pt comes in requesting ABX.   She was dx w/ UTI on 08/14, Proteus mirabilis by Cx. S to all ABX except nitrofurantoin. She was prescribed Keflex, but never got it filled.  She says that she does not have the funds to get the rx filled. It was also sent to the wrong pharmacy.   She says she will try to get the funds to get the ABX filled if we give her bus passes.  Today's Vitals   11/18/21 1443  BP: 129/85  Pulse: 83  Temp: 97.6 F (36.4 C)  SpO2: 91%   There is no height or weight on file to calculate BMI.  Pt afebrile and no flank tenderness.   She agrees to go to [facility] if we give her bus passes. Rx resent to [facility] and bus passes given.   Rx sent   Theodore Demark, PA-C 11/18/2021 2:45 PM

## 2021-12-03 ENCOUNTER — Other Ambulatory Visit: Payer: Self-pay

## 2021-12-03 ENCOUNTER — Emergency Department (HOSPITAL_COMMUNITY)
Admission: EM | Admit: 2021-12-03 | Discharge: 2021-12-03 | Disposition: A | Discharge status: Home / Self Care | Payer: Medicaid Other | Attending: Student | Admitting: Student

## 2021-12-03 ENCOUNTER — Encounter: Payer: Self-pay | Admitting: *Deleted

## 2021-12-03 DIAGNOSIS — F10920 Alcohol use, unspecified with intoxication, uncomplicated: Secondary | ICD-10-CM

## 2021-12-03 DIAGNOSIS — F10120 Alcohol abuse with intoxication, uncomplicated: Secondary | ICD-10-CM | POA: Diagnosis not present

## 2021-12-03 DIAGNOSIS — R4182 Altered mental status, unspecified: Secondary | ICD-10-CM | POA: Diagnosis present

## 2021-12-03 DIAGNOSIS — Z91040 Latex allergy status: Secondary | ICD-10-CM | POA: Diagnosis not present

## 2021-12-03 DIAGNOSIS — Y907 Blood alcohol level of 200-239 mg/100 ml: Secondary | ICD-10-CM | POA: Diagnosis not present

## 2021-12-03 LAB — CBC WITH DIFFERENTIAL/PLATELET
Abs Immature Granulocytes: 0.03 10*3/uL (ref 0.00–0.07)
Basophils Absolute: 0 10*3/uL (ref 0.0–0.1)
Basophils Relative: 1 %
Eosinophils Absolute: 0.1 10*3/uL (ref 0.0–0.5)
Eosinophils Relative: 1 %
HCT: 41.4 % (ref 36.0–46.0)
Hemoglobin: 13.2 g/dL (ref 12.0–15.0)
Immature Granulocytes: 0 %
Lymphocytes Relative: 34 %
Lymphs Abs: 2.3 10*3/uL (ref 0.7–4.0)
MCH: 27.6 pg (ref 26.0–34.0)
MCHC: 31.9 g/dL (ref 30.0–36.0)
MCV: 86.6 fL (ref 80.0–100.0)
Monocytes Absolute: 0.5 10*3/uL (ref 0.1–1.0)
Monocytes Relative: 7 %
Neutro Abs: 3.9 10*3/uL (ref 1.7–7.7)
Neutrophils Relative %: 57 %
Platelets: 290 10*3/uL (ref 150–400)
RBC: 4.78 MIL/uL (ref 3.87–5.11)
RDW: 15.9 % — ABNORMAL HIGH (ref 11.5–15.5)
WBC: 6.9 10*3/uL (ref 4.0–10.5)
nRBC: 0 % (ref 0.0–0.2)

## 2021-12-03 LAB — RAPID URINE DRUG SCREEN, HOSP PERFORMED
Amphetamines: NOT DETECTED
Barbiturates: NOT DETECTED
Benzodiazepines: NOT DETECTED
Cocaine: NOT DETECTED
Opiates: NOT DETECTED
Tetrahydrocannabinol: NOT DETECTED

## 2021-12-03 LAB — COMPREHENSIVE METABOLIC PANEL
ALT: 15 U/L (ref 0–44)
AST: 21 U/L (ref 15–41)
Albumin: 3.1 g/dL — ABNORMAL LOW (ref 3.5–5.0)
Alkaline Phosphatase: 54 U/L (ref 38–126)
Anion gap: 11 (ref 5–15)
BUN: 10 mg/dL (ref 6–20)
CO2: 16 mmol/L — ABNORMAL LOW (ref 22–32)
Calcium: 7.4 mg/dL — ABNORMAL LOW (ref 8.9–10.3)
Chloride: 120 mmol/L — ABNORMAL HIGH (ref 98–111)
Creatinine, Ser: 0.51 mg/dL (ref 0.44–1.00)
GFR, Estimated: >60 mL/min (ref >60)
Glucose, Bld: 80 mg/dL (ref 70–99)
Potassium: 3.3 mmol/L — ABNORMAL LOW (ref 3.5–5.1)
Sodium: 147 mmol/L — ABNORMAL HIGH (ref 135–145)
Total Bilirubin: 0.7 mg/dL (ref 0.3–1.2)
Total Protein: 5.8 g/dL — ABNORMAL LOW (ref 6.5–8.1)

## 2021-12-03 LAB — ETHANOL: Alcohol, Ethyl (B): 228 mg/dL — ABNORMAL HIGH (ref <10)

## 2021-12-03 MED ORDER — NALOXONE HCL 0.4 MG/ML IJ SOLN
INTRAMUSCULAR | Status: AC
  Administered 2021-12-03: 0.4 mg via INTRAVENOUS
  Filled 2021-12-03: qty 3

## 2021-12-03 MED ORDER — NALOXONE HCL 0.4 MG/ML IJ SOLN: 0.4000 mg | INTRAMUSCULAR | Status: DC | PRN

## 2021-12-03 NOTE — ED Notes (Signed)
Pt able to ambulated without assistance and able to tolerate food with no sign of nausea or vomiting. Dc instructions given and pt sent back to shelter on a [employer] taxi.

## 2021-12-03 NOTE — ED Triage Notes (Signed)
BIB [facility]. Found on bathroom floor. Unknown ingestion. N/V. 95% RA 158/90 CBG96 HR 140

## 2021-12-03 NOTE — ED Notes (Signed)
Pt remains somnolent. BP soft sys 90's. MD Kommor notified. Bolus ordered. Given promptly. Respiratory rate remains adequate- RR18-20 SpO2 >95% RA.
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
## 2021-12-04 NOTE — Congregational Nurse Program (Signed)
  Dept: [phone]   Congregational Nurse Program Note  Date of Encounter: 12/03/2021  Past Medical History: Past Medical History:  Diagnosis Date   ADHD    Latex allergy    Medical history non-contributory    Post partum depression     Encounter Details:  CNP Questionnaire - 12/03/21 1921       Questionnaire   Do you give verbal consent to treat you today? No    Location Patient Served  [facility]    Visit Setting [facility] or Organization    Patient Status Homeless    Insurance [facility]    Insurance Referral N/A    Medication Have Medication Insecurities    Medical Provider Yes    Screening Referrals N/A    Medical Referral ED    Medical Appointment Made N/A    Food Have Food Insecurities    Transportation Need transportation assistance    Housing/Utilities No permanent housing    Interpersonal Safety Do not feel safe at current residence    Intervention N/A    ED Visit Averted N/A    Life-Saving Intervention Made Yes
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
## 2021-12-16 ENCOUNTER — Emergency Department (HOSPITAL_COMMUNITY)
Admission: EM | Admit: 2021-12-16 | Discharge: 2021-12-17 | Disposition: A | Discharge status: Home / Self Care | Payer: Medicaid Other | Attending: Emergency Medicine | Admitting: Emergency Medicine

## 2021-12-16 DIAGNOSIS — Z79899 Other long term (current) drug therapy: Secondary | ICD-10-CM | POA: Insufficient documentation

## 2021-12-16 DIAGNOSIS — Z0441 Encounter for examination and observation following alleged adult rape: Secondary | ICD-10-CM | POA: Diagnosis present

## 2021-12-16 DIAGNOSIS — Z91040 Latex allergy status: Secondary | ICD-10-CM | POA: Insufficient documentation

## 2021-12-16 LAB — URINALYSIS, ROUTINE W REFLEX MICROSCOPIC
Bilirubin Urine: NEGATIVE
Glucose, UA: NEGATIVE mg/dL
Hgb urine dipstick: NEGATIVE
Ketones, ur: NEGATIVE mg/dL
Nitrite: NEGATIVE
Protein, ur: NEGATIVE mg/dL
Specific Gravity, Urine: 1.002 — ABNORMAL LOW (ref 1.005–1.030)
pH: 6 (ref 5.0–8.0)

## 2021-12-16 LAB — PREGNANCY, URINE: Preg Test, Ur: NEGATIVE
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
## 2021-12-17 LAB — RAPID URINE DRUG SCREEN, HOSP PERFORMED
Amphetamines: NOT DETECTED
Barbiturates: NOT DETECTED
Benzodiazepines: NOT DETECTED
Cocaine: NOT DETECTED
Opiates: NOT DETECTED
Tetrahydrocannabinol: NOT DETECTED

## 2021-12-17 MED ORDER — CEFDINIR 300 MG PO CAPS: 300.0000 mg | ORAL_CAPSULE | Freq: Two times a day (BID) | ORAL | 0 refills | Status: AC

## 2021-12-17 MED ORDER — ULIPRISTAL ACETATE 30 MG PO TABS
30.0000 mg | ORAL_TABLET | Freq: Once | ORAL | Status: AC
  Administered 2021-12-17: 30 mg via ORAL
  Filled 2021-12-17: qty 1

## 2021-12-17 MED ORDER — DOXYCYCLINE HYCLATE 100 MG PO TABS
100.0000 mg | ORAL_TABLET | Freq: Once | ORAL | Status: AC
  Administered 2021-12-17: 100 mg via ORAL
  Filled 2021-12-17: qty 1

## 2021-12-17 MED ORDER — DOXYCYCLINE HYCLATE 100 MG PO CAPS: 100.0000 mg | ORAL_CAPSULE | Freq: Two times a day (BID) | ORAL | 0 refills | Status: AC

## 2021-12-17 MED ORDER — FLUCONAZOLE 150 MG PO TABS
150.0000 mg | ORAL_TABLET | Freq: Once | ORAL | Status: AC
  Administered 2021-12-17: 150 mg via ORAL
  Filled 2021-12-17: qty 1

## 2021-12-17 MED ORDER — CEFTRIAXONE SODIUM 1 G IJ SOLR
500.0000 mg | Freq: Once | INTRAMUSCULAR | Status: AC
  Administered 2021-12-17: 500 mg via INTRAMUSCULAR
  Filled 2021-12-17: qty 10
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
## 2021-12-17 NOTE — SANE Note (Signed)
Patient is requesting medication for her UTI and a yeast infection medication.  Patient is also requesting assistance from Social Work in securing placement in a [place] where she can "be safe."

## 2021-12-17 NOTE — ED Notes (Signed)
Pt to be reevaluated by am SANE on call
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
## 2021-12-17 NOTE — SANE Note (Signed)
On 12/17/2021, at approximately 1152 hours, the SANE/FNE Naval architect) consult was completed. The primary RN and provider have been notified. Please contact the SANE/FNE nurse on call (listed in [place]) with any further concerns.
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
## 2021-12-17 NOTE — ED Notes (Signed)
Dr. Glick at the bedside.  
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
## 2021-12-17 NOTE — ED Notes (Signed)
Pt appears to be sleeping, observe even RR and unlabored, NAD noted, side rails up x2 for safety, plan of care ongoing, call light within reach, no further concerns as of present.   

## 2021-12-17 NOTE — SANE Note (Signed)
Follow-up Phone Call  Patient gives verbal consent for a FNE/SANE follow-up phone call in 48-72 hours: DID NOT ASK THE PT. Patient's telephone number: [phone] (PT'S CELL W/ VM & TEXTING) Patient gives verbal consent to leave voicemail at the phone number listed above: DID NOT ASK THE PT. DO NOT CALL between the hours of: N/A    PT'S EMAIL ADDRESS:  [email or web address]

## 2021-12-17 NOTE — ED Notes (Signed)
Patient says her stomach hurts because she has not eaten in 3 days.
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
## 2021-12-17 NOTE — ED Notes (Signed)
SANE called back in, they are on the way, pt notified and grateful.
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
## 2021-12-17 NOTE — ED Notes (Signed)
This RN spoke w/ Environmental health practitioner. Pt is A&Ox 4. Walking around & able to answer questions appropriately. SANE nurse aware.

## 2021-12-17 NOTE — Discharge Instructions (Addendum)
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 Headache Irregular menstrual cycles or spotting Menstrual cramps Nausea Stomach pain This list may not describe all possible side effects. Call your doctor for medical advice about  side effects. You may report side effects to FDA at 1-800-FDA-1088.

## 2021-12-17 NOTE — ED Notes (Signed)
SANE RN brought pt AVS, this RN asked pt if patient has any questions before discharge. Pt denied any questions at this time. Pt A&Ox4, ambulatory.
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
## 2021-12-17 NOTE — SANE Note (Signed)
The SANE/FNE (Forensic Nurse Examiner) consult has been completed. The primary RN and/or provider have been notified. Please contact the SANE/FNE nurse on call (listed in Amion) with any further concerns.  
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
## 2021-12-17 NOTE — ED Notes (Signed)
SANE has been notified by unit Network engineer

## 2021-12-17 NOTE — ED Notes (Signed)
SANE has arrived to consult pt
